# Patient Record
Sex: Male | Born: 1945 | Race: White | Hispanic: No | Marital: Married | Smoking: Former smoker
Health system: Southern US, Community
[De-identification: ages and names within clinical notes are randomized; demographics above are authoritative.]

## PROBLEM LIST (undated history)

## (undated) DIAGNOSIS — L309 Dermatitis, unspecified: Secondary | ICD-10-CM

## (undated) DIAGNOSIS — J3489 Other specified disorders of nose and nasal sinuses: Secondary | ICD-10-CM

## (undated) DIAGNOSIS — R51 Headache: Secondary | ICD-10-CM

## (undated) DIAGNOSIS — L570 Actinic keratosis: Secondary | ICD-10-CM

## (undated) DIAGNOSIS — R519 Headache, unspecified: Secondary | ICD-10-CM

## (undated) DIAGNOSIS — C4491 Basal cell carcinoma of skin, unspecified: Secondary | ICD-10-CM

## (undated) DIAGNOSIS — T7840XA Allergy, unspecified, initial encounter: Secondary | ICD-10-CM

## (undated) DIAGNOSIS — G8929 Other chronic pain: Secondary | ICD-10-CM

## (undated) HISTORY — PX: FOOT SURGERY: SHX648

## (undated) HISTORY — DX: Headache: R51

## (undated) HISTORY — DX: Dermatitis, unspecified: L30.9

## (undated) HISTORY — DX: Allergy, unspecified, initial encounter: T78.40XA

## (undated) HISTORY — DX: Headache, unspecified: R51.9

## (undated) HISTORY — DX: Actinic keratosis: L57.0

## (undated) HISTORY — DX: Other chronic pain: G89.29

## (undated) HISTORY — DX: Basal cell carcinoma of skin, unspecified: C44.91

## (undated) HISTORY — DX: Other specified disorders of nose and nasal sinuses: J34.89

---

## 2007-12-05 HISTORY — PX: CHOLECYSTECTOMY: SHX55

## 2012-03-08 DIAGNOSIS — Q6689 Other  specified congenital deformities of feet: Secondary | ICD-10-CM | POA: Diagnosis not present

## 2012-03-08 DIAGNOSIS — G5760 Lesion of plantar nerve, unspecified lower limb: Secondary | ICD-10-CM | POA: Diagnosis not present

## 2012-06-07 DIAGNOSIS — Z85828 Personal history of other malignant neoplasm of skin: Secondary | ICD-10-CM | POA: Diagnosis not present

## 2012-06-07 DIAGNOSIS — D235 Other benign neoplasm of skin of trunk: Secondary | ICD-10-CM | POA: Diagnosis not present

## 2012-06-07 DIAGNOSIS — L719 Rosacea, unspecified: Secondary | ICD-10-CM | POA: Diagnosis not present

## 2012-07-20 DIAGNOSIS — Z0000 Encounter for general adult medical examination without abnormal findings: Secondary | ICD-10-CM | POA: Diagnosis not present

## 2012-07-20 DIAGNOSIS — E785 Hyperlipidemia, unspecified: Secondary | ICD-10-CM | POA: Diagnosis not present

## 2012-08-16 DIAGNOSIS — Z23 Encounter for immunization: Secondary | ICD-10-CM | POA: Diagnosis not present

## 2012-08-30 DIAGNOSIS — Z86010 Personal history of colonic polyps: Secondary | ICD-10-CM | POA: Diagnosis not present

## 2012-08-30 DIAGNOSIS — Z800 Family history of malignant neoplasm of digestive organs: Secondary | ICD-10-CM | POA: Diagnosis not present

## 2012-08-30 DIAGNOSIS — K2270 Barrett's esophagus without dysplasia: Secondary | ICD-10-CM | POA: Diagnosis not present

## 2012-10-24 DIAGNOSIS — K2270 Barrett's esophagus without dysplasia: Secondary | ICD-10-CM | POA: Diagnosis not present

## 2012-10-24 DIAGNOSIS — K2990 Gastroduodenitis, unspecified, without bleeding: Secondary | ICD-10-CM | POA: Diagnosis not present

## 2012-10-24 DIAGNOSIS — K2970 Gastritis, unspecified, without bleeding: Secondary | ICD-10-CM | POA: Diagnosis not present

## 2012-10-24 DIAGNOSIS — D131 Benign neoplasm of stomach: Secondary | ICD-10-CM | POA: Diagnosis not present

## 2012-10-24 DIAGNOSIS — K449 Diaphragmatic hernia without obstruction or gangrene: Secondary | ICD-10-CM | POA: Diagnosis not present

## 2012-11-13 DIAGNOSIS — Z6826 Body mass index (BMI) 26.0-26.9, adult: Secondary | ICD-10-CM | POA: Diagnosis not present

## 2012-11-13 DIAGNOSIS — J019 Acute sinusitis, unspecified: Secondary | ICD-10-CM | POA: Diagnosis not present

## 2012-12-17 DIAGNOSIS — J342 Deviated nasal septum: Secondary | ICD-10-CM | POA: Diagnosis not present

## 2012-12-17 DIAGNOSIS — J343 Hypertrophy of nasal turbinates: Secondary | ICD-10-CM | POA: Diagnosis not present

## 2012-12-17 DIAGNOSIS — R51 Headache: Secondary | ICD-10-CM | POA: Diagnosis not present

## 2012-12-17 DIAGNOSIS — J3489 Other specified disorders of nose and nasal sinuses: Secondary | ICD-10-CM | POA: Diagnosis not present

## 2013-01-13 DIAGNOSIS — R51 Headache: Secondary | ICD-10-CM | POA: Diagnosis not present

## 2013-01-13 DIAGNOSIS — J3489 Other specified disorders of nose and nasal sinuses: Secondary | ICD-10-CM | POA: Diagnosis not present

## 2013-01-13 DIAGNOSIS — J342 Deviated nasal septum: Secondary | ICD-10-CM | POA: Diagnosis not present

## 2013-01-17 DIAGNOSIS — J3489 Other specified disorders of nose and nasal sinuses: Secondary | ICD-10-CM | POA: Diagnosis not present

## 2013-01-24 DIAGNOSIS — J342 Deviated nasal septum: Secondary | ICD-10-CM | POA: Diagnosis not present

## 2013-01-24 DIAGNOSIS — Q300 Choanal atresia: Secondary | ICD-10-CM | POA: Diagnosis not present

## 2013-03-14 DIAGNOSIS — R51 Headache: Secondary | ICD-10-CM | POA: Diagnosis not present

## 2013-03-14 DIAGNOSIS — J310 Chronic rhinitis: Secondary | ICD-10-CM | POA: Diagnosis not present

## 2013-03-14 DIAGNOSIS — J342 Deviated nasal septum: Secondary | ICD-10-CM | POA: Diagnosis not present

## 2013-03-17 ENCOUNTER — Other Ambulatory Visit: Payer: Self-pay | Admitting: Specialist

## 2013-03-17 DIAGNOSIS — Z049 Encounter for examination and observation for unspecified reason: Secondary | ICD-10-CM | POA: Diagnosis not present

## 2013-03-17 DIAGNOSIS — Z79899 Other long term (current) drug therapy: Secondary | ICD-10-CM | POA: Diagnosis not present

## 2013-03-17 DIAGNOSIS — G4452 New daily persistent headache (NDPH): Secondary | ICD-10-CM | POA: Diagnosis not present

## 2013-03-21 ENCOUNTER — Ambulatory Visit
Admission: RE | Admit: 2013-03-21 | Discharge: 2013-03-21 | Disposition: A | Discharge status: Home / Self Care | Payer: Medicare Other | Source: Ambulatory Visit | Attending: Specialist | Admitting: Specialist

## 2013-03-21 DIAGNOSIS — I7789 Other specified disorders of arteries and arterioles: Secondary | ICD-10-CM | POA: Diagnosis not present

## 2013-03-21 DIAGNOSIS — G4452 New daily persistent headache (NDPH): Secondary | ICD-10-CM

## 2013-03-21 DIAGNOSIS — R51 Headache: Secondary | ICD-10-CM | POA: Diagnosis not present

## 2013-03-21 MED ORDER — GADOBENATE DIMEGLUMINE 529 MG/ML IV SOLN
19.0000 mL | Freq: Once | INTRAVENOUS | Status: AC | PRN
  Administered 2013-03-21: 19 mL via INTRAVENOUS

## 2013-03-27 DIAGNOSIS — R51 Headache: Secondary | ICD-10-CM | POA: Diagnosis not present

## 2013-03-27 DIAGNOSIS — Z888 Allergy status to other drugs, medicaments and biological substances status: Secondary | ICD-10-CM | POA: Diagnosis not present

## 2013-03-27 DIAGNOSIS — G518 Other disorders of facial nerve: Secondary | ICD-10-CM | POA: Diagnosis not present

## 2013-03-27 DIAGNOSIS — IMO0001 Reserved for inherently not codable concepts without codable children: Secondary | ICD-10-CM | POA: Diagnosis not present

## 2013-03-27 DIAGNOSIS — Z6826 Body mass index (BMI) 26.0-26.9, adult: Secondary | ICD-10-CM | POA: Diagnosis not present

## 2013-03-27 DIAGNOSIS — J019 Acute sinusitis, unspecified: Secondary | ICD-10-CM | POA: Diagnosis not present

## 2013-03-27 DIAGNOSIS — M542 Cervicalgia: Secondary | ICD-10-CM | POA: Diagnosis not present

## 2013-04-10 DIAGNOSIS — R51 Headache: Secondary | ICD-10-CM | POA: Diagnosis not present

## 2013-04-10 DIAGNOSIS — IMO0001 Reserved for inherently not codable concepts without codable children: Secondary | ICD-10-CM | POA: Diagnosis not present

## 2013-04-10 DIAGNOSIS — G518 Other disorders of facial nerve: Secondary | ICD-10-CM | POA: Diagnosis not present

## 2013-04-10 DIAGNOSIS — M542 Cervicalgia: Secondary | ICD-10-CM | POA: Diagnosis not present

## 2013-04-17 DIAGNOSIS — J342 Deviated nasal septum: Secondary | ICD-10-CM | POA: Diagnosis not present

## 2013-04-17 DIAGNOSIS — J343 Hypertrophy of nasal turbinates: Secondary | ICD-10-CM | POA: Diagnosis not present

## 2013-04-17 DIAGNOSIS — J3489 Other specified disorders of nose and nasal sinuses: Secondary | ICD-10-CM | POA: Diagnosis not present

## 2013-05-01 DIAGNOSIS — IMO0001 Reserved for inherently not codable concepts without codable children: Secondary | ICD-10-CM | POA: Diagnosis not present

## 2013-05-01 DIAGNOSIS — M542 Cervicalgia: Secondary | ICD-10-CM | POA: Diagnosis not present

## 2013-05-01 DIAGNOSIS — R51 Headache: Secondary | ICD-10-CM | POA: Diagnosis not present

## 2013-05-01 DIAGNOSIS — G518 Other disorders of facial nerve: Secondary | ICD-10-CM | POA: Diagnosis not present

## 2013-05-15 DIAGNOSIS — IMO0001 Reserved for inherently not codable concepts without codable children: Secondary | ICD-10-CM | POA: Diagnosis not present

## 2013-05-15 DIAGNOSIS — M542 Cervicalgia: Secondary | ICD-10-CM | POA: Diagnosis not present

## 2013-05-15 DIAGNOSIS — R51 Headache: Secondary | ICD-10-CM | POA: Diagnosis not present

## 2013-05-15 DIAGNOSIS — G518 Other disorders of facial nerve: Secondary | ICD-10-CM | POA: Diagnosis not present

## 2013-06-11 DIAGNOSIS — M542 Cervicalgia: Secondary | ICD-10-CM | POA: Diagnosis not present

## 2013-06-11 DIAGNOSIS — G518 Other disorders of facial nerve: Secondary | ICD-10-CM | POA: Diagnosis not present

## 2013-06-11 DIAGNOSIS — R51 Headache: Secondary | ICD-10-CM | POA: Diagnosis not present

## 2013-06-11 DIAGNOSIS — G4452 New daily persistent headache (NDPH): Secondary | ICD-10-CM | POA: Diagnosis not present

## 2013-06-12 DIAGNOSIS — D235 Other benign neoplasm of skin of trunk: Secondary | ICD-10-CM | POA: Diagnosis not present

## 2013-06-12 DIAGNOSIS — L819 Disorder of pigmentation, unspecified: Secondary | ICD-10-CM | POA: Diagnosis not present

## 2013-06-12 DIAGNOSIS — L570 Actinic keratosis: Secondary | ICD-10-CM | POA: Diagnosis not present

## 2013-07-24 DIAGNOSIS — M542 Cervicalgia: Secondary | ICD-10-CM | POA: Diagnosis not present

## 2013-07-24 DIAGNOSIS — R51 Headache: Secondary | ICD-10-CM | POA: Diagnosis not present

## 2013-07-24 DIAGNOSIS — G4452 New daily persistent headache (NDPH): Secondary | ICD-10-CM | POA: Diagnosis not present

## 2013-07-24 DIAGNOSIS — G518 Other disorders of facial nerve: Secondary | ICD-10-CM | POA: Diagnosis not present

## 2013-08-11 DIAGNOSIS — Z0000 Encounter for general adult medical examination without abnormal findings: Secondary | ICD-10-CM | POA: Diagnosis not present

## 2013-08-11 DIAGNOSIS — Z9181 History of falling: Secondary | ICD-10-CM | POA: Diagnosis not present

## 2013-08-11 DIAGNOSIS — Z6826 Body mass index (BMI) 26.0-26.9, adult: Secondary | ICD-10-CM | POA: Diagnosis not present

## 2013-08-11 DIAGNOSIS — I498 Other specified cardiac arrhythmias: Secondary | ICD-10-CM | POA: Diagnosis not present

## 2013-08-11 DIAGNOSIS — Z125 Encounter for screening for malignant neoplasm of prostate: Secondary | ICD-10-CM | POA: Diagnosis not present

## 2013-08-11 DIAGNOSIS — E785 Hyperlipidemia, unspecified: Secondary | ICD-10-CM | POA: Diagnosis not present

## 2013-08-19 DIAGNOSIS — Z23 Encounter for immunization: Secondary | ICD-10-CM | POA: Diagnosis not present

## 2013-10-23 DIAGNOSIS — Z79899 Other long term (current) drug therapy: Secondary | ICD-10-CM | POA: Diagnosis not present

## 2013-10-23 DIAGNOSIS — K2270 Barrett's esophagus without dysplasia: Secondary | ICD-10-CM | POA: Diagnosis not present

## 2013-10-23 DIAGNOSIS — Z800 Family history of malignant neoplasm of digestive organs: Secondary | ICD-10-CM | POA: Diagnosis not present

## 2013-10-23 DIAGNOSIS — K219 Gastro-esophageal reflux disease without esophagitis: Secondary | ICD-10-CM | POA: Diagnosis not present

## 2013-10-23 DIAGNOSIS — Z86010 Personal history of colonic polyps: Secondary | ICD-10-CM | POA: Diagnosis not present

## 2013-10-27 DIAGNOSIS — R51 Headache: Secondary | ICD-10-CM | POA: Diagnosis not present

## 2013-10-27 DIAGNOSIS — G4452 New daily persistent headache (NDPH): Secondary | ICD-10-CM | POA: Diagnosis not present

## 2013-12-17 DIAGNOSIS — Z86010 Personal history of colonic polyps: Secondary | ICD-10-CM | POA: Diagnosis not present

## 2013-12-17 DIAGNOSIS — Z800 Family history of malignant neoplasm of digestive organs: Secondary | ICD-10-CM | POA: Diagnosis not present

## 2013-12-17 DIAGNOSIS — Z1211 Encounter for screening for malignant neoplasm of colon: Secondary | ICD-10-CM | POA: Diagnosis not present

## 2013-12-17 DIAGNOSIS — D126 Benign neoplasm of colon, unspecified: Secondary | ICD-10-CM | POA: Diagnosis not present

## 2014-01-06 HISTORY — PX: TOOTH EXTRACTION: SUR596

## 2014-01-15 DIAGNOSIS — J209 Acute bronchitis, unspecified: Secondary | ICD-10-CM | POA: Diagnosis not present

## 2014-01-15 DIAGNOSIS — Z6825 Body mass index (BMI) 25.0-25.9, adult: Secondary | ICD-10-CM | POA: Diagnosis not present

## 2014-01-28 DIAGNOSIS — G4452 New daily persistent headache (NDPH): Secondary | ICD-10-CM | POA: Diagnosis not present

## 2014-01-28 DIAGNOSIS — Z79899 Other long term (current) drug therapy: Secondary | ICD-10-CM | POA: Diagnosis not present

## 2014-01-30 DIAGNOSIS — J209 Acute bronchitis, unspecified: Secondary | ICD-10-CM | POA: Diagnosis not present

## 2014-01-30 DIAGNOSIS — R0989 Other specified symptoms and signs involving the circulatory and respiratory systems: Secondary | ICD-10-CM | POA: Diagnosis not present

## 2014-02-24 ENCOUNTER — Ambulatory Visit (INDEPENDENT_AMBULATORY_CARE_PROVIDER_SITE_OTHER): Payer: Medicare Other | Admitting: Internal Medicine

## 2014-02-24 ENCOUNTER — Encounter: Payer: Self-pay | Admitting: Internal Medicine

## 2014-02-24 ENCOUNTER — Encounter (INDEPENDENT_AMBULATORY_CARE_PROVIDER_SITE_OTHER): Payer: Self-pay

## 2014-02-24 VITALS — BP 122/80 | HR 90 | Temp 98.3°F | Ht 73.5 in | Wt 190.8 lb

## 2014-02-24 DIAGNOSIS — R05 Cough: Secondary | ICD-10-CM

## 2014-02-24 DIAGNOSIS — R059 Cough, unspecified: Secondary | ICD-10-CM

## 2014-02-24 DIAGNOSIS — R058 Other specified cough: Secondary | ICD-10-CM

## 2014-02-24 MED ORDER — AMOXICILLIN-POT CLAVULANATE 875-125 MG PO TABS: 1.0000 | ORAL_TABLET | Freq: Two times a day (BID) | ORAL | Status: DC

## 2014-02-24 MED ORDER — ACETAMINOPHEN-CODEINE #3 300-30 MG PO TABS: ORAL_TABLET | ORAL | Status: AC

## 2014-02-24 NOTE — Progress Notes (Signed)
   Subjective:    Patient ID: Mark Valencia, male    DOB: 01/28/1946 MRN: [id number]  HPI  62 yowm remote minimal smoker with runny nose and sinus pain in 2013 eval Dr Gaylyn Cheers ENT with neg CT sinus then [facility] eval neg > neurology eval> MRI nl rec neurontin did help pain some but continued watery rhinitis gradually improved then had 4 different procedures R upper molar and after extraction feb 3rd  2015 developed cough 48h p procedure started with laugh then coughed ever since so referred by Dr Carolanne Grumbling for pulmonary eval 02/24/2014    02/24/2014 1st [facility] office visit/ Surabhi Gadea Chief Complaint  Patient presents with  . Pulmonary Consult    Referred per Dr. Nelda Bucks. Pt c/o cough since beginning of Feb 2015. Cough is non prod and seems worse first thing in the am.   does ok supine, worse when moving.  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 No unusual exp hx or h/o childhood pna/ asthma or knowledge of premature birth.  Sleeping ok without nocturnal  or early am exacerbation  of respiratory  c/o's or need for noct saba. Also denies any obvious fluctuation of symptoms with weather or environmental changes or other aggravating or alleviating factors except as outlined above   Current Medications, Allergies, Complete Past Medical History, Past Surgical History, Family History, and Social History were reviewed in [employer] record.              Review of Systems  Constitutional: Negative for fever, chills, activity change, appetite change and unexpected weight change.  HENT: Positive for dental  problem. Negative for congestion, postnasal drip, rhinorrhea, sneezing, sore throat, trouble swallowing and voice change.   Eyes: Negative for visual disturbance.  Respiratory: Positive for cough. Negative for choking and shortness of breath.   Cardiovascular: Negative for chest pain and leg swelling.  Gastrointestinal: Negative for nausea, vomiting and abdominal pain.  Genitourinary: Negative for difficulty urinating.  Musculoskeletal: Negative for arthralgias.  Skin: Negative for rash.  Neurological: Positive for headaches.  Psychiatric/Behavioral: Negative for behavioral problems and confusion.       Objective:   Physical Exam  amb wm tends to ramble nad  Wt Readings from Last 3 Encounters:  02/24/14 190 lb 12.8 oz (86.546 kg)      HEENT: nl dentition, turbinates, and orophanx. Nl external ear canals without cough reflex   NECK :  without JVD/Nodes/TM/ nl carotid upstrokes bilaterally   LUNGS: no acc muscle use, clear to A and P bilaterally without cough on insp or exp maneuvers   CV:  RRR  no s3 or murmur or increase in P2, no edema   ABD:  soft and nontender with nl excursion in the supine position. No bruits or organomegaly, bowel sounds nl  MS:  warm without deformities, calf tenderness, cyanosis or clubbing  SKIN: warm and dry without lesions    NEURO:  alert, approp, no deficits    cxr 01/30/14  No active dz      Assessment & Plan:

## 2014-02-24 NOTE — Patient Instructions (Signed)
Continue nexium 40 Take 30- 60 min before your first and last meals of the day   GERD (REFLUX)  is an extremely common cause of respiratory symptoms, many times with no significant heartburn at all.    It can be treated with medication, but also with lifestyle changes including avoidance of late meals, excessive alcohol, smoking cessation, and avoid fatty foods, chocolate, peppermint, colas, red wine, and acidic juices such as orange juice.  NO MINT OR MENTHOL PRODUCTS SO NO COUGH DROPS  USE SUGARLESS CANDY INSTEAD (jolley ranchers or Stover's)  NO OIL BASED VITAMINS - use powdered substitutes.    Take delsym two tsp every 12 hours and supplement if needed with tylenol #3  up to 2 every 4 hours to suppress the urge to cough. Swallowing water or using ice chips/non mint and menthol containing candies (such as lifesavers or sugarless jolly ranchers) are also effective.  You should rest your voice and avoid activities that you know make you cough.  Once you have eliminated the cough for 3 straight days try reducing theTylenol #3  first,  then the delsym as tolerated.    Augmentin 875 mg take one pill twice daily  X 10 days - take at breakfast and supper with large glass of water.  It would help reduce the usual side effects (diarrhea and yeast infections) if you ate cultured yogurt at lunch.   Please see patient coordinator before you leave today  to schedule CT Sinus   Return end of next week to check your progress

## 2014-02-25 ENCOUNTER — Ambulatory Visit (INDEPENDENT_AMBULATORY_CARE_PROVIDER_SITE_OTHER)
Admission: RE | Admit: 2014-02-25 | Discharge: 2014-02-25 | Disposition: A | Discharge status: Home / Self Care | Payer: Medicare Other | Source: Ambulatory Visit | Attending: Internal Medicine | Admitting: Internal Medicine

## 2014-02-25 ENCOUNTER — Encounter: Payer: Self-pay | Admitting: Internal Medicine

## 2014-02-25 DIAGNOSIS — R059 Cough, unspecified: Secondary | ICD-10-CM | POA: Diagnosis not present

## 2014-02-25 DIAGNOSIS — R058 Other specified cough: Secondary | ICD-10-CM

## 2014-02-25 DIAGNOSIS — R05 Cough: Secondary | ICD-10-CM

## 2014-02-25 NOTE — Assessment & Plan Note (Addendum)
The most common causes of chronic cough in immunocompetent adults include the following: upper airway cough syndrome (UACS), previously referred to as postnasal drip syndrome (PNDS), which is caused by variety of rhinosinus conditions; (2) asthma; (3) GERD; (4) chronic bronchitis from cigarette smoking or other inhaled environmental irritants; (5) nonasthmatic eosinophilic bronchitis; and (6) bronchiectasis.   These conditions, singly or in combination, have accounted for up to 94% of the causes of chronic cough in prospective studies.   Other conditions have constituted no >6% of the causes in prospective studies These have included bronchogenic carcinoma, chronic interstitial pneumonia, sarcoidosis, left ventricular failure, ACEI-induced cough, and aspiration from a condition associated with pharyngeal dysfunction.    Chronic cough is often simultaneously caused by more than one condition. A single cause has been found from 38 to 82% of the time, multiple causes from 18 to 62%. Multiply caused cough has been the result of three diseases up to 42% of the time.       Based on hx and exam, this is most likely:  Classic Upper airway cough syndrome, so named because it's frequently impossible to sort out how much is  CR/sinusitis with freq throat clearing (which can be related to primary GERD)   vs  causing  secondary (" extra esophageal")  GERD from wide swings in gastric pressure that occur with throat clearing, often  promoting self use of mint and menthol lozenges that reduce the lower esophageal sphincter tone and exacerbate the problem further in a cyclical fashion.   These are the same pts (now being labeled as having "irritable larynx syndrome" by some cough centers) who not infrequently have a history of having failed to tolerate ace inhibitors,  dry powder inhalers or biphosphonates or report having atypical reflux symptoms that don't respond to standard doses of PPI , and are easily confused as  having aecopd or asthma flares by even experienced allergists/ pulmonologists.   The first step is to check sinus ct, start augmentin in case this is upper tooth related sinusitis, and in meantime continue  maximize acid suppression and eliminate cyclical coughing then regroup if the cough persists.  See instructions for specific recommendations which were reviewed directly with the patient who was given a copy with highlighter outlining the key components.

## 2014-02-26 NOTE — Progress Notes (Signed)
Quick Note:  Spoke with pt and notified of results per Dr. Wert. Pt verbalized understanding and denied any questions.  ______ 

## 2014-03-05 ENCOUNTER — Ambulatory Visit (INDEPENDENT_AMBULATORY_CARE_PROVIDER_SITE_OTHER): Payer: Medicare Other | Admitting: Internal Medicine

## 2014-03-05 ENCOUNTER — Encounter: Payer: Self-pay | Admitting: Internal Medicine

## 2014-03-05 VITALS — BP 112/64 | HR 71 | Temp 98.0°F | Ht 73.5 in | Wt 195.2 lb

## 2014-03-05 DIAGNOSIS — R05 Cough: Secondary | ICD-10-CM | POA: Diagnosis not present

## 2014-03-05 DIAGNOSIS — R059 Cough, unspecified: Secondary | ICD-10-CM | POA: Diagnosis not present

## 2014-03-05 DIAGNOSIS — R058 Other specified cough: Secondary | ICD-10-CM

## 2014-03-05 MED ORDER — AMOXICILLIN-POT CLAVULANATE 875-125 MG PO TABS: 1.0000 | ORAL_TABLET | Freq: Two times a day (BID) | ORAL | Status: AC

## 2014-03-05 NOTE — Progress Notes (Signed)
Subjective:    Patient ID: Mark Valencia, male    DOB: 1946-01-02   MRN: [id number]    Brief patient profile:  88 yowm remote minimal smoker with runny nose and sinus pain in 2013 eval Mark Mark Valencia ENT with neg CT sinus then Mark Valencia eval neg > neurology eval> MRI nl rec neurontin did help pain some but continued watery rhinitis gradually improved then had 4 different procedures R upper molar and after extraction feb 3rd  2015 developed cough 48h p procedure started with laugh then coughed ever since so referred by Mark Valencia for pulmonary eval 02/24/2014    History of Present Illness  02/24/2014 1st [facility] office visit/ Mark Valencia Chief Complaint  Patient presents with  . Pulmonary Consult    Referred per Mark Valencia. Pt c/o cough since beginning of Feb 2015. Cough is non prod and seems worse first thing in the am.   does ok supine, worse when moving.  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   Augmentin 875 mg take one pill twice daily  X 10 days -   o schedule CT Sinus > pos mucosal thickening in area of tooth surgery    03/05/2014 f/u ov/Mark Valencia re: throat drainage/ always clear/ no trouble at hs ever Chief Complaint  Patient presents with  . Follow-up    Pt states that his cough is some better, but not completely resolved. No new co's today.   more recent problem green mucus only in daytime > still present but improved and  Not limited by breathing from desired activities.  Had prev seen ent docx 2 for symptoms of excess pnds assoc  with "sinus pain"  but neg w/u per pt and sinus pain improved on neurontin in low doses.  Previous to tootch extraction did not have green drainage and already went back to dentist x 2 with Disc of new R max sinus finding in hand.   No obvious day to day or daytime variabilty or assoc  cp or chest tightness, subjective wheeze overt   hb symptoms. No unusual exp hx or h/o childhood pna/ asthma or knowledge of premature birth.  Sleeping ok without nocturnal  or early am exacerbation  of respiratory  c/o's or need for noct saba. Also denies any obvious fluctuation of symptoms with weather or environmental changes or other aggravating or alleviating factors except as outlined above   Current Medications, Allergies, Complete Past Medical History, Past Surgical History, Family History, and Social History were reviewed in [employer] record.  ROS  The following are not active complaints unless bolded sore throat, dysphagia, dental problems, itching, sneezing,  nasal congestion or excess/ purulent secretions, ear ache,   fever, chills, sweats, unintended wt loss, pleuritic or exertional cp, hemoptysis,  orthopnea pnd or leg swelling, presyncope, palpitations, heartburn, abdominal pain, anorexia, nausea, vomiting, diarrhea  or change in bowel or urinary habits, change in stools or urine, dysuria,hematuria,  rash, arthralgias,  visual complaints, headache, numbness weakness or ataxia or problems with walking or coordination,  change in mood/affect or memory.           Objective:   Physical Exam  amb wm tends to ramble nad   Wt Readings from Last 3 Encounters:  03/05/14 195 lb 3.2 oz (88.542 kg)  02/24/14 190 lb 12.8 oz (86.546 kg)         HEENT: nl dentition, turbinates, and orophanx. Nl external ear canals without cough reflex   NECK :  without JVD/Nodes/TM/ nl carotid upstrokes bilaterally   LUNGS: no acc muscle use, clear to A and P bilaterally without cough on insp or  exp maneuvers   CV:  RRR  no s3 or murmur or increase in P2, no edema   ABD:  soft and nontender with nl excursion in the supine position. No bruits or organomegaly, bowel sounds nl  MS:  warm without deformities, calf tenderness, cyanosis or clubbing  SKIN: warm and dry without lesions    NEURO:  alert, approp, no deficits    cxr 01/30/14  No active dz      Assessment & Plan:

## 2014-03-05 NOTE — Patient Instructions (Addendum)
Augmentin 875 mg take one pill twice daily  X 10 days - take at breakfast and supper with large glass of water.  It would help reduce the usual side effects (diarrhea and yeast infections) if you ate cultured yogurt at lunch.   If mucus is still green after next round of augmentin you will additional ENT evaluation of your R maxillary sinus (I don't know what the relation is with your tooth surgery but Dr Lucia Gaskins could help sort this out)  Increase neurontin to 300 mg three times a day and this should help your throat irritation

## 2014-03-06 NOTE — Assessment & Plan Note (Signed)
-   Sinus Ct  02/25/2014 >> Negative paranasal sinuses except for mild chronic maxillary alveolar recess mucosal thickening.> rx augmentin x 20 d  Still has sensation of too much throat mucus out of proportion to physical findings so try first increase chronic neurontin dose to 300 mg tid  If green drainage recurs will need ent re-eval

## 2014-03-31 DIAGNOSIS — L821 Other seborrheic keratosis: Secondary | ICD-10-CM | POA: Diagnosis not present

## 2014-03-31 DIAGNOSIS — L819 Disorder of pigmentation, unspecified: Secondary | ICD-10-CM | POA: Diagnosis not present

## 2014-03-31 DIAGNOSIS — D235 Other benign neoplasm of skin of trunk: Secondary | ICD-10-CM | POA: Diagnosis not present

## 2014-03-31 DIAGNOSIS — L820 Inflamed seborrheic keratosis: Secondary | ICD-10-CM | POA: Diagnosis not present

## 2014-04-01 DIAGNOSIS — R059 Cough, unspecified: Secondary | ICD-10-CM | POA: Diagnosis not present

## 2014-04-01 DIAGNOSIS — K2270 Barrett's esophagus without dysplasia: Secondary | ICD-10-CM | POA: Diagnosis not present

## 2014-04-01 DIAGNOSIS — K219 Gastro-esophageal reflux disease without esophagitis: Secondary | ICD-10-CM | POA: Diagnosis not present

## 2014-04-01 DIAGNOSIS — Z86010 Personal history of colonic polyps: Secondary | ICD-10-CM | POA: Diagnosis not present

## 2014-04-01 DIAGNOSIS — R05 Cough: Secondary | ICD-10-CM | POA: Diagnosis not present

## 2014-04-30 DIAGNOSIS — L820 Inflamed seborrheic keratosis: Secondary | ICD-10-CM | POA: Diagnosis not present

## 2014-05-03 IMAGING — CT CT PARANASAL SINUSES LIMITED
1 of 2 series · 16 of 19 positions shown, 20 images · non-contrast
Comparison: Brain MRI 03/21/2013.  Paranasal sinus CT 01/17/2013.

CLINICAL DATA: 67-year-old male with cough. Chronic sinus pain.
Upper airway cough syndrome. Initial encounter.

EXAM:
CT PARANASAL SINUS WITHOUT CONTRAST
TECHNIQUE: Multidetector CT images of the paranasal sinuses were obtained using
the standard protocol without intravenous contrast.

[Series 4: ltd sinus 3.0 h30s · axial · 0.35mm/px · z∈[-74,+21]mm · 16 of 18 slices shown, 20 images]
[im 2/18  brain]
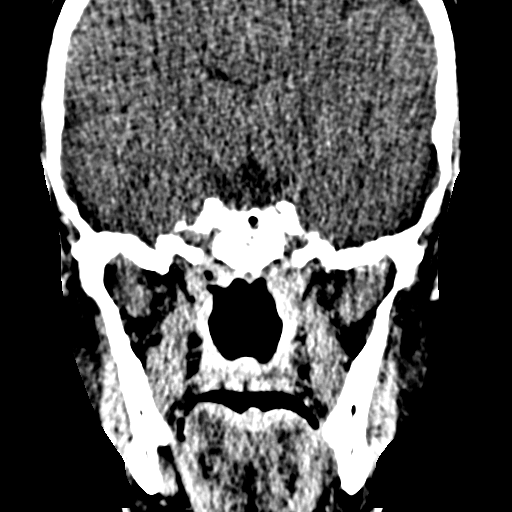
[im 2/18  bone]
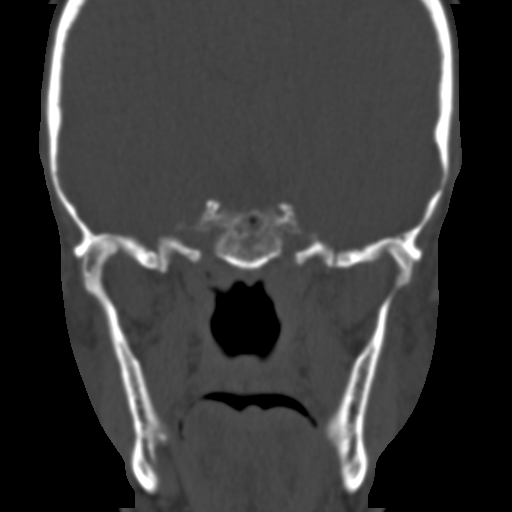
[im 3/18  bone]
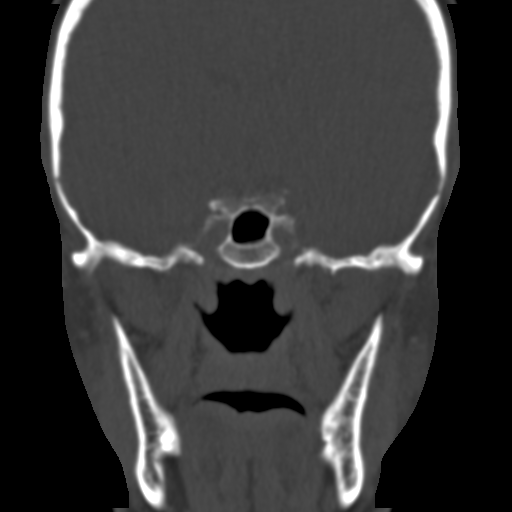
[im 4/18  bone]
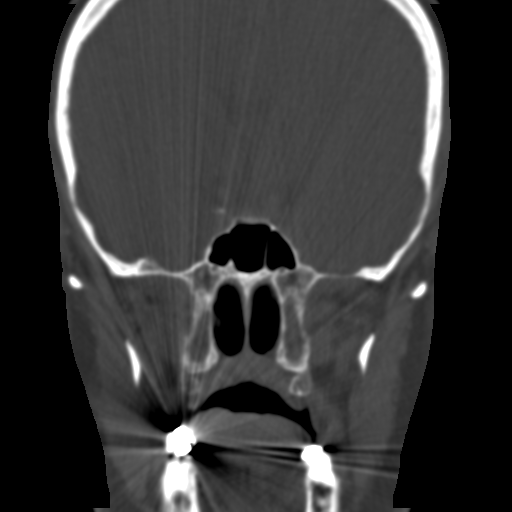
[im 5/18  bone]
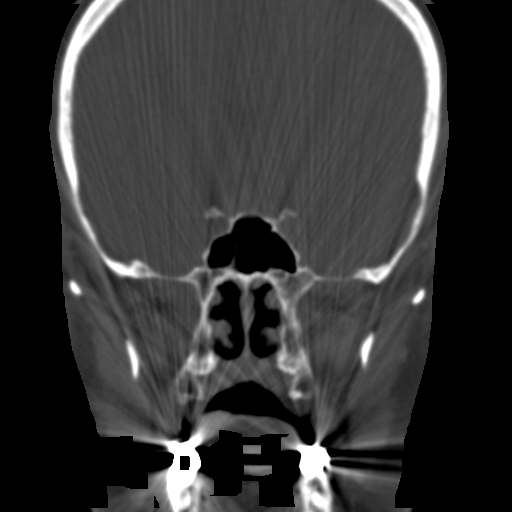
[im 6/18  brain]
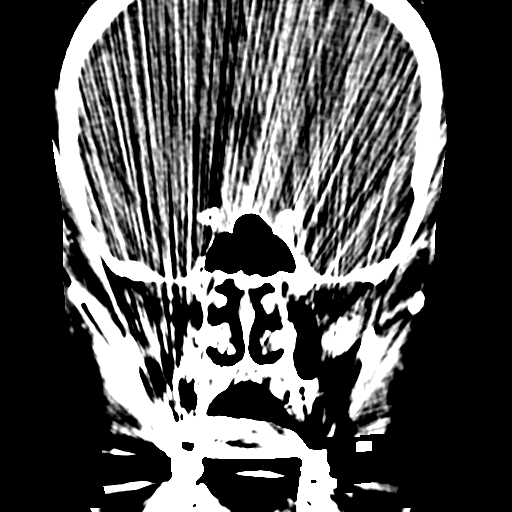
[im 6/18  bone]
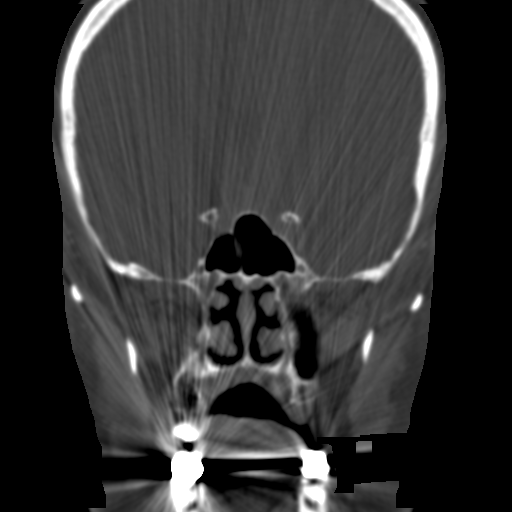
[im 7/18  bone]
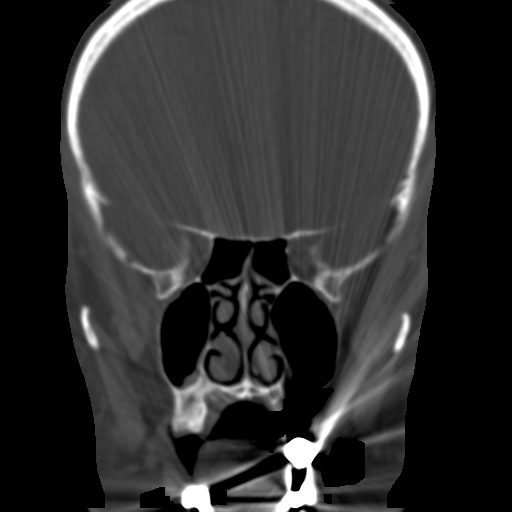
[im 8/18  bone]
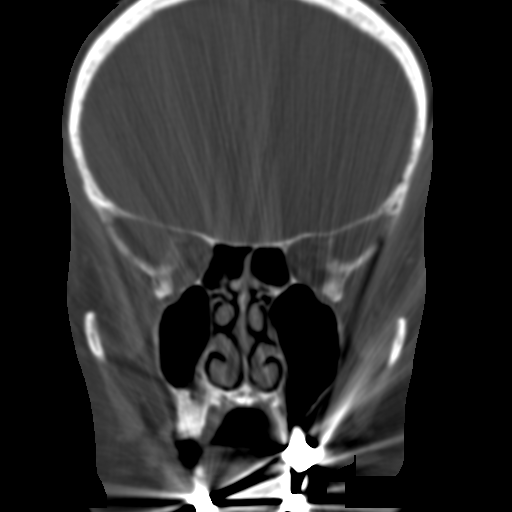
[im 9/18  bone]
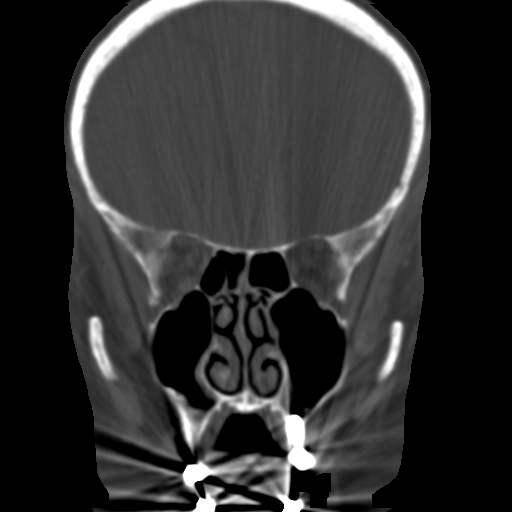
[im 10/18  brain]
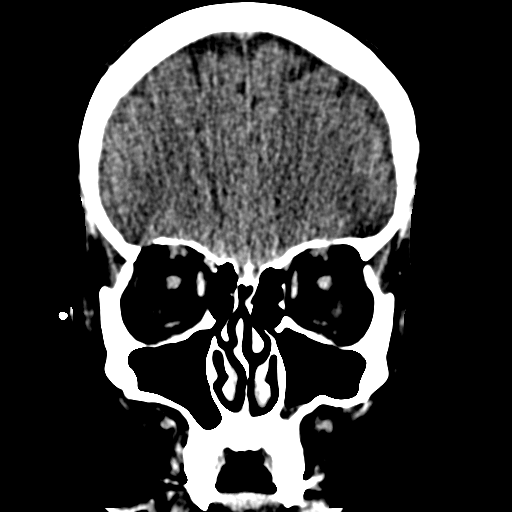
[im 10/18  bone]
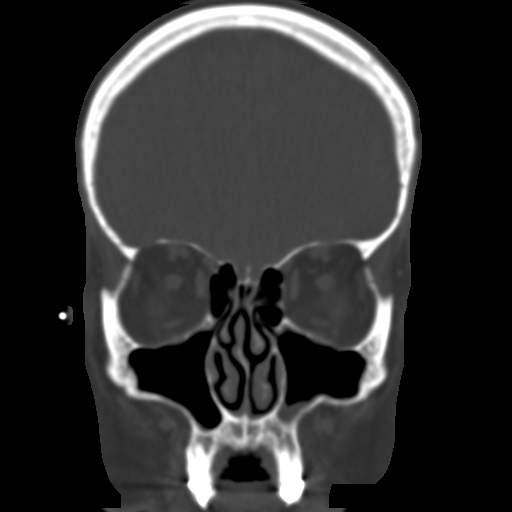
[im 11/18  bone]
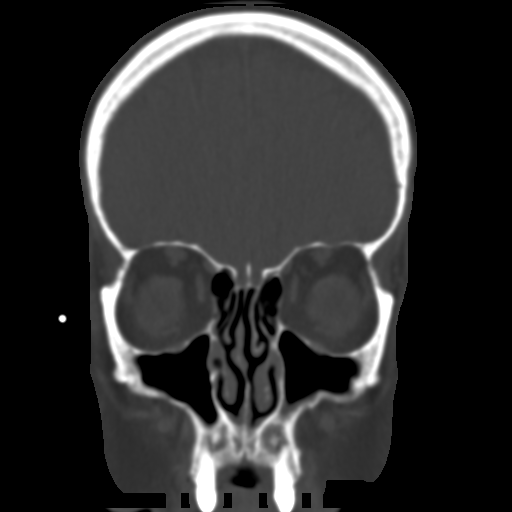
[im 12/18  bone]
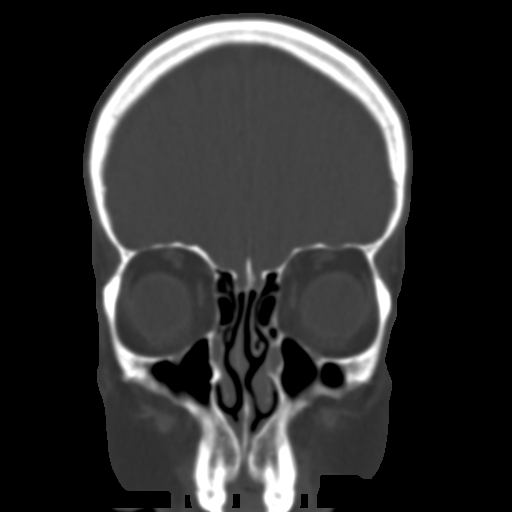
[im 13/18  bone]
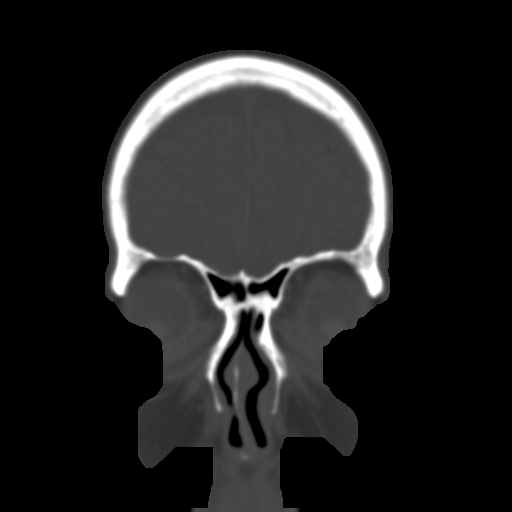
[im 14/18  brain]
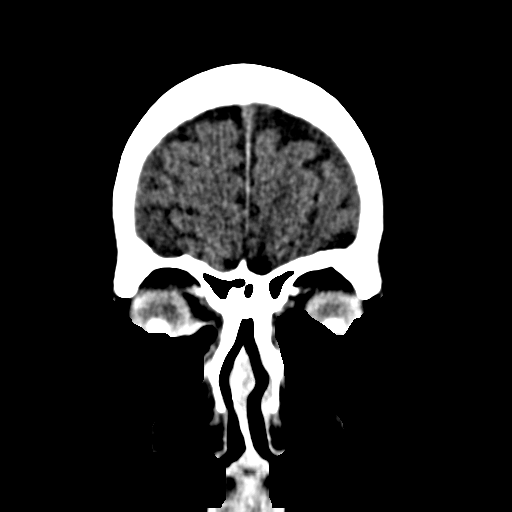
[im 14/18  bone]
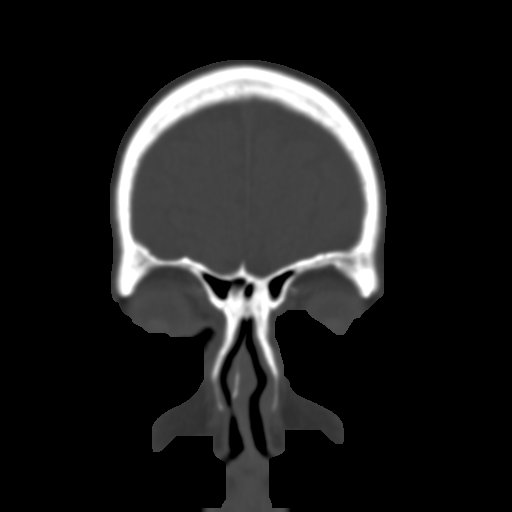
[im 15/18  bone]
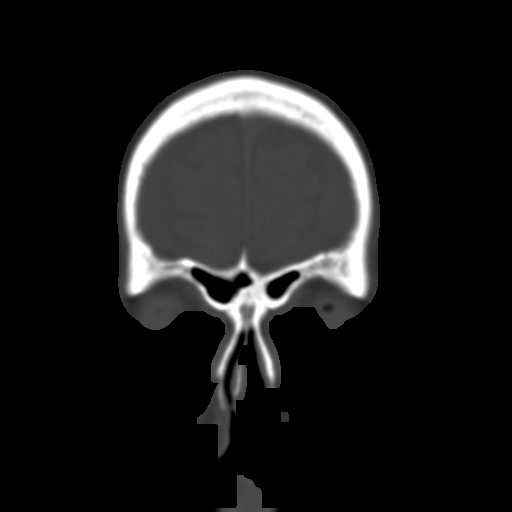
[im 16/18  bone]
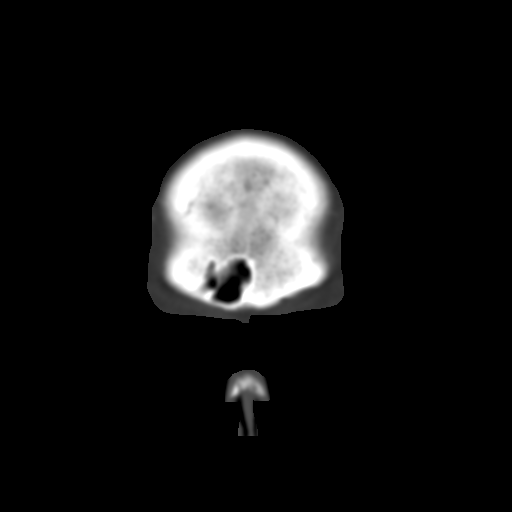
[im 17/18  bone]
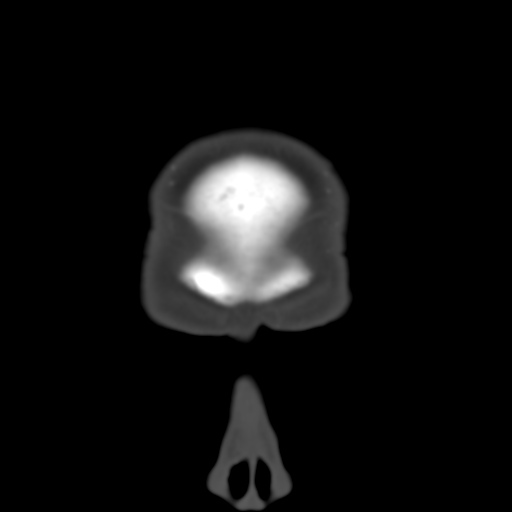

[16 of 19 positions shown; findings below may reference images not displayed]

FINDINGS: Grossly stable and negative visualized non contrast brain
parenchyma. Grossly negative visualized orbit and face soft tissues.

The sphenoid sinuses are clear.

The ethmoid air cells are clear.

The frontal sinuses are clear.

Both maxillary sinuses are clear except for Mild chronic mucosal
thickening in the alveolar recesses.

Both OMCs appear patent.

Rightward nasal septal deviation. No acute osseous abnormality
identified.
IMPRESSION: Negative paranasal sinuses except for mild chronic maxillary
alveolar recess mucosal thickening.

## 2014-05-28 DIAGNOSIS — G4452 New daily persistent headache (NDPH): Secondary | ICD-10-CM | POA: Diagnosis not present

## 2014-08-12 DIAGNOSIS — E785 Hyperlipidemia, unspecified: Secondary | ICD-10-CM | POA: Diagnosis not present

## 2014-08-12 DIAGNOSIS — Z125 Encounter for screening for malignant neoplasm of prostate: Secondary | ICD-10-CM | POA: Diagnosis not present

## 2014-08-12 DIAGNOSIS — Z23 Encounter for immunization: Secondary | ICD-10-CM | POA: Diagnosis not present

## 2014-08-12 DIAGNOSIS — Z0000 Encounter for general adult medical examination without abnormal findings: Secondary | ICD-10-CM | POA: Diagnosis not present

## 2014-08-12 DIAGNOSIS — Z6825 Body mass index (BMI) 25.0-25.9, adult: Secondary | ICD-10-CM | POA: Diagnosis not present

## 2014-09-22 DIAGNOSIS — G4452 New daily persistent headache (NDPH): Secondary | ICD-10-CM | POA: Diagnosis not present

## 2014-12-17 DIAGNOSIS — L719 Rosacea, unspecified: Secondary | ICD-10-CM | POA: Diagnosis not present

## 2014-12-17 DIAGNOSIS — L300 Nummular dermatitis: Secondary | ICD-10-CM | POA: Diagnosis not present

## 2015-01-21 DIAGNOSIS — Z79899 Other long term (current) drug therapy: Secondary | ICD-10-CM | POA: Diagnosis not present

## 2015-01-21 DIAGNOSIS — R05 Cough: Secondary | ICD-10-CM | POA: Diagnosis not present

## 2015-01-21 DIAGNOSIS — Z86010 Personal history of colonic polyps: Secondary | ICD-10-CM | POA: Diagnosis not present

## 2015-01-21 DIAGNOSIS — K2270 Barrett's esophagus without dysplasia: Secondary | ICD-10-CM | POA: Diagnosis not present

## 2015-01-21 DIAGNOSIS — K219 Gastro-esophageal reflux disease without esophagitis: Secondary | ICD-10-CM | POA: Diagnosis not present

## 2015-01-22 DIAGNOSIS — R51 Headache: Secondary | ICD-10-CM | POA: Diagnosis not present

## 2015-01-22 DIAGNOSIS — G4452 New daily persistent headache (NDPH): Secondary | ICD-10-CM | POA: Diagnosis not present

## 2015-02-18 DIAGNOSIS — Z6826 Body mass index (BMI) 26.0-26.9, adult: Secondary | ICD-10-CM | POA: Diagnosis not present

## 2015-02-18 DIAGNOSIS — J101 Influenza due to other identified influenza virus with other respiratory manifestations: Secondary | ICD-10-CM | POA: Diagnosis not present

## 2015-04-27 DIAGNOSIS — L821 Other seborrheic keratosis: Secondary | ICD-10-CM | POA: Diagnosis not present

## 2015-04-27 DIAGNOSIS — Z08 Encounter for follow-up examination after completed treatment for malignant neoplasm: Secondary | ICD-10-CM | POA: Diagnosis not present

## 2015-04-27 DIAGNOSIS — D225 Melanocytic nevi of trunk: Secondary | ICD-10-CM | POA: Diagnosis not present

## 2015-04-27 DIAGNOSIS — Z85828 Personal history of other malignant neoplasm of skin: Secondary | ICD-10-CM | POA: Diagnosis not present

## 2015-07-19 DIAGNOSIS — Z86010 Personal history of colonic polyps: Secondary | ICD-10-CM | POA: Diagnosis not present

## 2015-07-19 DIAGNOSIS — K2270 Barrett's esophagus without dysplasia: Secondary | ICD-10-CM | POA: Diagnosis not present

## 2015-07-19 DIAGNOSIS — Z800 Family history of malignant neoplasm of digestive organs: Secondary | ICD-10-CM | POA: Diagnosis not present

## 2015-07-19 DIAGNOSIS — K219 Gastro-esophageal reflux disease without esophagitis: Secondary | ICD-10-CM | POA: Diagnosis not present

## 2015-08-18 DIAGNOSIS — Z136 Encounter for screening for cardiovascular disorders: Secondary | ICD-10-CM | POA: Diagnosis not present

## 2015-08-18 DIAGNOSIS — R05 Cough: Secondary | ICD-10-CM | POA: Diagnosis not present

## 2015-08-18 DIAGNOSIS — K219 Gastro-esophageal reflux disease without esophagitis: Secondary | ICD-10-CM | POA: Diagnosis not present

## 2015-08-18 DIAGNOSIS — K2270 Barrett's esophagus without dysplasia: Secondary | ICD-10-CM | POA: Diagnosis not present

## 2015-08-18 DIAGNOSIS — F419 Anxiety disorder, unspecified: Secondary | ICD-10-CM | POA: Diagnosis not present

## 2015-08-18 DIAGNOSIS — Z125 Encounter for screening for malignant neoplasm of prostate: Secondary | ICD-10-CM | POA: Diagnosis not present

## 2015-08-18 DIAGNOSIS — R51 Headache: Secondary | ICD-10-CM | POA: Diagnosis not present

## 2015-08-18 DIAGNOSIS — Z1389 Encounter for screening for other disorder: Secondary | ICD-10-CM | POA: Diagnosis not present

## 2015-08-18 DIAGNOSIS — Z0000 Encounter for general adult medical examination without abnormal findings: Secondary | ICD-10-CM | POA: Diagnosis not present

## 2015-08-18 DIAGNOSIS — Z9181 History of falling: Secondary | ICD-10-CM | POA: Diagnosis not present

## 2015-08-18 DIAGNOSIS — E785 Hyperlipidemia, unspecified: Secondary | ICD-10-CM | POA: Diagnosis not present

## 2015-08-18 DIAGNOSIS — Z23 Encounter for immunization: Secondary | ICD-10-CM | POA: Diagnosis not present

## 2015-09-21 DIAGNOSIS — K319 Disease of stomach and duodenum, unspecified: Secondary | ICD-10-CM | POA: Diagnosis not present

## 2015-09-21 DIAGNOSIS — K317 Polyp of stomach and duodenum: Secondary | ICD-10-CM | POA: Diagnosis not present

## 2015-09-21 DIAGNOSIS — Z8719 Personal history of other diseases of the digestive system: Secondary | ICD-10-CM | POA: Diagnosis not present

## 2015-09-21 DIAGNOSIS — K208 Other esophagitis: Secondary | ICD-10-CM | POA: Diagnosis not present

## 2015-09-21 DIAGNOSIS — K229 Disease of esophagus, unspecified: Secondary | ICD-10-CM | POA: Diagnosis not present

## 2015-09-21 DIAGNOSIS — K210 Gastro-esophageal reflux disease with esophagitis: Secondary | ICD-10-CM | POA: Diagnosis not present

## 2015-09-21 DIAGNOSIS — D131 Benign neoplasm of stomach: Secondary | ICD-10-CM | POA: Diagnosis not present

## 2015-10-11 DIAGNOSIS — K219 Gastro-esophageal reflux disease without esophagitis: Secondary | ICD-10-CM | POA: Diagnosis not present

## 2015-10-11 DIAGNOSIS — R1010 Upper abdominal pain, unspecified: Secondary | ICD-10-CM | POA: Diagnosis not present

## 2015-10-11 DIAGNOSIS — K2270 Barrett's esophagus without dysplasia: Secondary | ICD-10-CM | POA: Diagnosis not present

## 2015-12-31 DIAGNOSIS — L821 Other seborrheic keratosis: Secondary | ICD-10-CM | POA: Diagnosis not present

## 2015-12-31 DIAGNOSIS — R203 Hyperesthesia: Secondary | ICD-10-CM | POA: Diagnosis not present

## 2015-12-31 DIAGNOSIS — D225 Melanocytic nevi of trunk: Secondary | ICD-10-CM | POA: Diagnosis not present

## 2015-12-31 DIAGNOSIS — L309 Dermatitis, unspecified: Secondary | ICD-10-CM | POA: Diagnosis not present

## 2015-12-31 DIAGNOSIS — D1801 Hemangioma of skin and subcutaneous tissue: Secondary | ICD-10-CM | POA: Diagnosis not present

## 2015-12-31 DIAGNOSIS — L814 Other melanin hyperpigmentation: Secondary | ICD-10-CM | POA: Diagnosis not present

## 2016-06-27 DIAGNOSIS — L219 Seborrheic dermatitis, unspecified: Secondary | ICD-10-CM | POA: Diagnosis not present

## 2016-06-27 DIAGNOSIS — L720 Epidermal cyst: Secondary | ICD-10-CM | POA: Diagnosis not present

## 2016-06-27 DIAGNOSIS — D485 Neoplasm of uncertain behavior of skin: Secondary | ICD-10-CM | POA: Diagnosis not present

## 2016-06-27 DIAGNOSIS — L905 Scar conditions and fibrosis of skin: Secondary | ICD-10-CM | POA: Diagnosis not present

## 2016-08-22 DIAGNOSIS — R51 Headache: Secondary | ICD-10-CM | POA: Diagnosis not present

## 2016-08-22 DIAGNOSIS — Z125 Encounter for screening for malignant neoplasm of prostate: Secondary | ICD-10-CM | POA: Diagnosis not present

## 2016-08-22 DIAGNOSIS — E785 Hyperlipidemia, unspecified: Secondary | ICD-10-CM | POA: Diagnosis not present

## 2016-08-22 DIAGNOSIS — K219 Gastro-esophageal reflux disease without esophagitis: Secondary | ICD-10-CM | POA: Diagnosis not present

## 2016-08-22 DIAGNOSIS — Z9181 History of falling: Secondary | ICD-10-CM | POA: Diagnosis not present

## 2016-08-22 DIAGNOSIS — Z0000 Encounter for general adult medical examination without abnormal findings: Secondary | ICD-10-CM | POA: Diagnosis not present

## 2016-08-22 DIAGNOSIS — Z1389 Encounter for screening for other disorder: Secondary | ICD-10-CM | POA: Diagnosis not present

## 2016-08-22 DIAGNOSIS — Z6825 Body mass index (BMI) 25.0-25.9, adult: Secondary | ICD-10-CM | POA: Diagnosis not present

## 2016-08-22 DIAGNOSIS — F419 Anxiety disorder, unspecified: Secondary | ICD-10-CM | POA: Diagnosis not present

## 2016-10-16 DIAGNOSIS — K219 Gastro-esophageal reflux disease without esophagitis: Secondary | ICD-10-CM | POA: Diagnosis not present

## 2016-10-16 DIAGNOSIS — K2270 Barrett's esophagus without dysplasia: Secondary | ICD-10-CM | POA: Diagnosis not present

## 2017-01-01 DIAGNOSIS — L570 Actinic keratosis: Secondary | ICD-10-CM | POA: Diagnosis not present

## 2017-01-01 DIAGNOSIS — L301 Dyshidrosis [pompholyx]: Secondary | ICD-10-CM | POA: Diagnosis not present

## 2017-01-01 DIAGNOSIS — L821 Other seborrheic keratosis: Secondary | ICD-10-CM | POA: Diagnosis not present

## 2017-01-01 DIAGNOSIS — L820 Inflamed seborrheic keratosis: Secondary | ICD-10-CM | POA: Diagnosis not present

## 2017-01-01 DIAGNOSIS — D2239 Melanocytic nevi of other parts of face: Secondary | ICD-10-CM | POA: Diagnosis not present

## 2017-01-01 DIAGNOSIS — D235 Other benign neoplasm of skin of trunk: Secondary | ICD-10-CM | POA: Diagnosis not present

## 2017-01-01 DIAGNOSIS — D1801 Hemangioma of skin and subcutaneous tissue: Secondary | ICD-10-CM | POA: Diagnosis not present

## 2017-01-11 DIAGNOSIS — Z6826 Body mass index (BMI) 26.0-26.9, adult: Secondary | ICD-10-CM | POA: Diagnosis not present

## 2017-01-11 DIAGNOSIS — B029 Zoster without complications: Secondary | ICD-10-CM | POA: Diagnosis not present

## 2017-01-12 DIAGNOSIS — B0230 Zoster ocular disease, unspecified: Secondary | ICD-10-CM | POA: Diagnosis not present

## 2017-01-17 DIAGNOSIS — B0230 Zoster ocular disease, unspecified: Secondary | ICD-10-CM | POA: Diagnosis not present

## 2017-01-25 DIAGNOSIS — B0230 Zoster ocular disease, unspecified: Secondary | ICD-10-CM | POA: Diagnosis not present

## 2017-04-19 DIAGNOSIS — L309 Dermatitis, unspecified: Secondary | ICD-10-CM | POA: Diagnosis not present

## 2017-08-09 DIAGNOSIS — Z0000 Encounter for general adult medical examination without abnormal findings: Secondary | ICD-10-CM | POA: Diagnosis not present

## 2017-08-09 DIAGNOSIS — Z125 Encounter for screening for malignant neoplasm of prostate: Secondary | ICD-10-CM | POA: Diagnosis not present

## 2017-08-09 DIAGNOSIS — Z1389 Encounter for screening for other disorder: Secondary | ICD-10-CM | POA: Diagnosis not present

## 2017-08-09 DIAGNOSIS — Z9181 History of falling: Secondary | ICD-10-CM | POA: Diagnosis not present

## 2017-08-09 DIAGNOSIS — Z136 Encounter for screening for cardiovascular disorders: Secondary | ICD-10-CM | POA: Diagnosis not present

## 2017-08-09 DIAGNOSIS — E785 Hyperlipidemia, unspecified: Secondary | ICD-10-CM | POA: Diagnosis not present

## 2017-08-23 DIAGNOSIS — L719 Rosacea, unspecified: Secondary | ICD-10-CM | POA: Diagnosis not present

## 2017-08-23 DIAGNOSIS — Z1389 Encounter for screening for other disorder: Secondary | ICD-10-CM | POA: Diagnosis not present

## 2017-08-23 DIAGNOSIS — E785 Hyperlipidemia, unspecified: Secondary | ICD-10-CM | POA: Diagnosis not present

## 2017-08-23 DIAGNOSIS — F419 Anxiety disorder, unspecified: Secondary | ICD-10-CM | POA: Diagnosis not present

## 2017-08-23 DIAGNOSIS — Z125 Encounter for screening for malignant neoplasm of prostate: Secondary | ICD-10-CM | POA: Diagnosis not present

## 2017-08-23 DIAGNOSIS — Z9181 History of falling: Secondary | ICD-10-CM | POA: Diagnosis not present

## 2017-08-23 DIAGNOSIS — K219 Gastro-esophageal reflux disease without esophagitis: Secondary | ICD-10-CM | POA: Diagnosis not present

## 2017-08-23 DIAGNOSIS — K2270 Barrett's esophagus without dysplasia: Secondary | ICD-10-CM | POA: Diagnosis not present

## 2017-08-23 DIAGNOSIS — R51 Headache: Secondary | ICD-10-CM | POA: Diagnosis not present

## 2017-08-23 DIAGNOSIS — Z6826 Body mass index (BMI) 26.0-26.9, adult: Secondary | ICD-10-CM | POA: Diagnosis not present

## 2017-09-06 DIAGNOSIS — H40021 Open angle with borderline findings, high risk, right eye: Secondary | ICD-10-CM | POA: Diagnosis not present

## 2017-11-22 DIAGNOSIS — J019 Acute sinusitis, unspecified: Secondary | ICD-10-CM | POA: Diagnosis not present

## 2017-12-31 DIAGNOSIS — D225 Melanocytic nevi of trunk: Secondary | ICD-10-CM | POA: Diagnosis not present

## 2017-12-31 DIAGNOSIS — L821 Other seborrheic keratosis: Secondary | ICD-10-CM | POA: Diagnosis not present

## 2017-12-31 DIAGNOSIS — L814 Other melanin hyperpigmentation: Secondary | ICD-10-CM | POA: Diagnosis not present

## 2017-12-31 DIAGNOSIS — L918 Other hypertrophic disorders of the skin: Secondary | ICD-10-CM | POA: Diagnosis not present

## 2018-01-25 DIAGNOSIS — L905 Scar conditions and fibrosis of skin: Secondary | ICD-10-CM | POA: Diagnosis not present

## 2018-07-25 DIAGNOSIS — L218 Other seborrheic dermatitis: Secondary | ICD-10-CM | POA: Diagnosis not present

## 2018-07-25 DIAGNOSIS — L814 Other melanin hyperpigmentation: Secondary | ICD-10-CM | POA: Diagnosis not present

## 2018-07-25 DIAGNOSIS — D229 Melanocytic nevi, unspecified: Secondary | ICD-10-CM | POA: Diagnosis not present

## 2018-07-25 DIAGNOSIS — L821 Other seborrheic keratosis: Secondary | ICD-10-CM | POA: Diagnosis not present

## 2018-07-25 DIAGNOSIS — Z85828 Personal history of other malignant neoplasm of skin: Secondary | ICD-10-CM | POA: Diagnosis not present

## 2018-07-25 DIAGNOSIS — L578 Other skin changes due to chronic exposure to nonionizing radiation: Secondary | ICD-10-CM | POA: Diagnosis not present

## 2018-07-26 DIAGNOSIS — H11823 Conjunctivochalasis, bilateral: Secondary | ICD-10-CM | POA: Diagnosis not present

## 2018-07-31 DIAGNOSIS — Z800 Family history of malignant neoplasm of digestive organs: Secondary | ICD-10-CM | POA: Diagnosis not present

## 2018-07-31 DIAGNOSIS — K2270 Barrett's esophagus without dysplasia: Secondary | ICD-10-CM | POA: Diagnosis not present

## 2018-07-31 DIAGNOSIS — Z86010 Personal history of colonic polyps: Secondary | ICD-10-CM | POA: Diagnosis not present

## 2018-07-31 DIAGNOSIS — Z1211 Encounter for screening for malignant neoplasm of colon: Secondary | ICD-10-CM | POA: Diagnosis not present

## 2018-08-09 DIAGNOSIS — H11823 Conjunctivochalasis, bilateral: Secondary | ICD-10-CM | POA: Diagnosis not present

## 2018-08-15 DIAGNOSIS — K209 Esophagitis, unspecified: Secondary | ICD-10-CM | POA: Diagnosis not present

## 2018-08-15 DIAGNOSIS — K317 Polyp of stomach and duodenum: Secondary | ICD-10-CM | POA: Diagnosis not present

## 2018-08-15 DIAGNOSIS — Z86010 Personal history of colonic polyps: Secondary | ICD-10-CM | POA: Diagnosis not present

## 2018-08-15 DIAGNOSIS — K2950 Unspecified chronic gastritis without bleeding: Secondary | ICD-10-CM | POA: Diagnosis not present

## 2018-08-15 DIAGNOSIS — K2270 Barrett's esophagus without dysplasia: Secondary | ICD-10-CM | POA: Diagnosis not present

## 2018-08-15 DIAGNOSIS — Z1211 Encounter for screening for malignant neoplasm of colon: Secondary | ICD-10-CM | POA: Diagnosis not present

## 2018-08-15 DIAGNOSIS — K2930 Chronic superficial gastritis without bleeding: Secondary | ICD-10-CM | POA: Diagnosis not present

## 2018-08-15 DIAGNOSIS — K5730 Diverticulosis of large intestine without perforation or abscess without bleeding: Secondary | ICD-10-CM | POA: Diagnosis not present

## 2018-08-15 DIAGNOSIS — K3189 Other diseases of stomach and duodenum: Secondary | ICD-10-CM | POA: Diagnosis not present

## 2018-08-15 DIAGNOSIS — Z800 Family history of malignant neoplasm of digestive organs: Secondary | ICD-10-CM | POA: Diagnosis not present

## 2018-08-15 DIAGNOSIS — K649 Unspecified hemorrhoids: Secondary | ICD-10-CM | POA: Diagnosis not present

## 2018-08-15 DIAGNOSIS — K210 Gastro-esophageal reflux disease with esophagitis: Secondary | ICD-10-CM | POA: Diagnosis not present

## 2018-08-15 DIAGNOSIS — K449 Diaphragmatic hernia without obstruction or gangrene: Secondary | ICD-10-CM | POA: Diagnosis not present

## 2018-08-27 DIAGNOSIS — Z1331 Encounter for screening for depression: Secondary | ICD-10-CM | POA: Diagnosis not present

## 2018-08-27 DIAGNOSIS — F419 Anxiety disorder, unspecified: Secondary | ICD-10-CM | POA: Diagnosis not present

## 2018-08-27 DIAGNOSIS — Z9181 History of falling: Secondary | ICD-10-CM | POA: Diagnosis not present

## 2018-08-27 DIAGNOSIS — Z0000 Encounter for general adult medical examination without abnormal findings: Secondary | ICD-10-CM | POA: Diagnosis not present

## 2018-08-27 DIAGNOSIS — Z125 Encounter for screening for malignant neoplasm of prostate: Secondary | ICD-10-CM | POA: Diagnosis not present

## 2018-08-27 DIAGNOSIS — K219 Gastro-esophageal reflux disease without esophagitis: Secondary | ICD-10-CM | POA: Diagnosis not present

## 2018-08-27 DIAGNOSIS — E785 Hyperlipidemia, unspecified: Secondary | ICD-10-CM | POA: Diagnosis not present

## 2018-08-29 DIAGNOSIS — Z136 Encounter for screening for cardiovascular disorders: Secondary | ICD-10-CM | POA: Diagnosis not present

## 2018-08-29 DIAGNOSIS — Z1331 Encounter for screening for depression: Secondary | ICD-10-CM | POA: Diagnosis not present

## 2018-08-29 DIAGNOSIS — Z0000 Encounter for general adult medical examination without abnormal findings: Secondary | ICD-10-CM | POA: Diagnosis not present

## 2018-08-29 DIAGNOSIS — Z125 Encounter for screening for malignant neoplasm of prostate: Secondary | ICD-10-CM | POA: Diagnosis not present

## 2018-08-29 DIAGNOSIS — E785 Hyperlipidemia, unspecified: Secondary | ICD-10-CM | POA: Diagnosis not present

## 2018-08-29 DIAGNOSIS — Z9181 History of falling: Secondary | ICD-10-CM | POA: Diagnosis not present

## 2018-09-23 DIAGNOSIS — M7751 Other enthesopathy of right foot: Secondary | ICD-10-CM | POA: Diagnosis not present

## 2018-09-23 DIAGNOSIS — M19071 Primary osteoarthritis, right ankle and foot: Secondary | ICD-10-CM | POA: Diagnosis not present

## 2018-09-23 DIAGNOSIS — G5763 Lesion of plantar nerve, bilateral lower limbs: Secondary | ICD-10-CM | POA: Diagnosis not present

## 2018-09-23 DIAGNOSIS — M19072 Primary osteoarthritis, left ankle and foot: Secondary | ICD-10-CM | POA: Diagnosis not present

## 2018-09-23 DIAGNOSIS — M7752 Other enthesopathy of left foot: Secondary | ICD-10-CM | POA: Diagnosis not present

## 2018-10-08 DIAGNOSIS — H5203 Hypermetropia, bilateral: Secondary | ICD-10-CM | POA: Diagnosis not present

## 2018-10-08 DIAGNOSIS — H2513 Age-related nuclear cataract, bilateral: Secondary | ICD-10-CM | POA: Diagnosis not present

## 2018-10-08 DIAGNOSIS — H40021 Open angle with borderline findings, high risk, right eye: Secondary | ICD-10-CM | POA: Diagnosis not present

## 2018-10-28 DIAGNOSIS — J029 Acute pharyngitis, unspecified: Secondary | ICD-10-CM | POA: Diagnosis not present

## 2018-12-06 DIAGNOSIS — H401111 Primary open-angle glaucoma, right eye, mild stage: Secondary | ICD-10-CM | POA: Diagnosis not present

## 2018-12-06 DIAGNOSIS — H2513 Age-related nuclear cataract, bilateral: Secondary | ICD-10-CM | POA: Diagnosis not present

## 2018-12-06 DIAGNOSIS — H11823 Conjunctivochalasis, bilateral: Secondary | ICD-10-CM | POA: Diagnosis not present

## 2018-12-17 DIAGNOSIS — H11823 Conjunctivochalasis, bilateral: Secondary | ICD-10-CM | POA: Diagnosis not present

## 2019-01-21 DIAGNOSIS — H2513 Age-related nuclear cataract, bilateral: Secondary | ICD-10-CM | POA: Diagnosis not present

## 2019-01-21 DIAGNOSIS — H11823 Conjunctivochalasis, bilateral: Secondary | ICD-10-CM | POA: Diagnosis not present

## 2019-01-21 DIAGNOSIS — H401111 Primary open-angle glaucoma, right eye, mild stage: Secondary | ICD-10-CM | POA: Diagnosis not present

## 2019-01-27 DIAGNOSIS — L821 Other seborrheic keratosis: Secondary | ICD-10-CM | POA: Diagnosis not present

## 2019-01-27 DIAGNOSIS — H2513 Age-related nuclear cataract, bilateral: Secondary | ICD-10-CM | POA: Diagnosis not present

## 2019-01-27 DIAGNOSIS — H2511 Age-related nuclear cataract, right eye: Secondary | ICD-10-CM | POA: Diagnosis not present

## 2019-01-27 DIAGNOSIS — L819 Disorder of pigmentation, unspecified: Secondary | ICD-10-CM | POA: Diagnosis not present

## 2019-01-27 DIAGNOSIS — Z85828 Personal history of other malignant neoplasm of skin: Secondary | ICD-10-CM | POA: Diagnosis not present

## 2019-01-27 DIAGNOSIS — D229 Melanocytic nevi, unspecified: Secondary | ICD-10-CM | POA: Diagnosis not present

## 2019-01-27 DIAGNOSIS — H40013 Open angle with borderline findings, low risk, bilateral: Secondary | ICD-10-CM | POA: Diagnosis not present

## 2019-01-27 DIAGNOSIS — L570 Actinic keratosis: Secondary | ICD-10-CM | POA: Diagnosis not present

## 2019-01-27 DIAGNOSIS — L814 Other melanin hyperpigmentation: Secondary | ICD-10-CM | POA: Diagnosis not present

## 2019-01-29 DIAGNOSIS — Z86010 Personal history of colonic polyps: Secondary | ICD-10-CM | POA: Diagnosis not present

## 2019-01-29 DIAGNOSIS — K219 Gastro-esophageal reflux disease without esophagitis: Secondary | ICD-10-CM | POA: Diagnosis not present

## 2019-01-29 DIAGNOSIS — K2270 Barrett's esophagus without dysplasia: Secondary | ICD-10-CM | POA: Diagnosis not present

## 2019-01-29 DIAGNOSIS — Z800 Family history of malignant neoplasm of digestive organs: Secondary | ICD-10-CM | POA: Diagnosis not present

## 2019-02-19 DIAGNOSIS — H40013 Open angle with borderline findings, low risk, bilateral: Secondary | ICD-10-CM | POA: Diagnosis not present

## 2019-02-19 DIAGNOSIS — H04123 Dry eye syndrome of bilateral lacrimal glands: Secondary | ICD-10-CM | POA: Diagnosis not present

## 2019-02-19 DIAGNOSIS — H25013 Cortical age-related cataract, bilateral: Secondary | ICD-10-CM | POA: Diagnosis not present

## 2019-02-19 DIAGNOSIS — H2513 Age-related nuclear cataract, bilateral: Secondary | ICD-10-CM | POA: Diagnosis not present

## 2019-03-12 DIAGNOSIS — B349 Viral infection, unspecified: Secondary | ICD-10-CM | POA: Diagnosis not present

## 2019-03-12 DIAGNOSIS — R6889 Other general symptoms and signs: Secondary | ICD-10-CM | POA: Diagnosis not present

## 2019-03-17 DIAGNOSIS — R05 Cough: Secondary | ICD-10-CM | POA: Diagnosis not present

## 2019-03-17 DIAGNOSIS — R509 Fever, unspecified: Secondary | ICD-10-CM | POA: Diagnosis not present

## 2019-03-17 DIAGNOSIS — J189 Pneumonia, unspecified organism: Secondary | ICD-10-CM | POA: Diagnosis not present

## 2019-03-17 DIAGNOSIS — J181 Lobar pneumonia, unspecified organism: Secondary | ICD-10-CM | POA: Diagnosis not present

## 2019-03-20 DIAGNOSIS — K219 Gastro-esophageal reflux disease without esophagitis: Secondary | ICD-10-CM | POA: Diagnosis not present

## 2019-09-01 DIAGNOSIS — H25013 Cortical age-related cataract, bilateral: Secondary | ICD-10-CM | POA: Diagnosis not present

## 2019-09-01 DIAGNOSIS — H2511 Age-related nuclear cataract, right eye: Secondary | ICD-10-CM | POA: Diagnosis not present

## 2019-09-01 DIAGNOSIS — H04123 Dry eye syndrome of bilateral lacrimal glands: Secondary | ICD-10-CM | POA: Diagnosis not present

## 2019-09-01 DIAGNOSIS — H2513 Age-related nuclear cataract, bilateral: Secondary | ICD-10-CM | POA: Diagnosis not present

## 2019-09-01 DIAGNOSIS — H25043 Posterior subcapsular polar age-related cataract, bilateral: Secondary | ICD-10-CM | POA: Diagnosis not present

## 2019-09-01 DIAGNOSIS — H40013 Open angle with borderline findings, low risk, bilateral: Secondary | ICD-10-CM | POA: Diagnosis not present

## 2019-09-03 DIAGNOSIS — H0102A Squamous blepharitis right eye, upper and lower eyelids: Secondary | ICD-10-CM | POA: Diagnosis not present

## 2019-09-03 DIAGNOSIS — Z1331 Encounter for screening for depression: Secondary | ICD-10-CM | POA: Diagnosis not present

## 2019-09-03 DIAGNOSIS — Z139 Encounter for screening, unspecified: Secondary | ICD-10-CM | POA: Diagnosis not present

## 2019-09-03 DIAGNOSIS — L719 Rosacea, unspecified: Secondary | ICD-10-CM | POA: Diagnosis not present

## 2019-09-03 DIAGNOSIS — H0102B Squamous blepharitis left eye, upper and lower eyelids: Secondary | ICD-10-CM | POA: Diagnosis not present

## 2019-09-03 DIAGNOSIS — Z125 Encounter for screening for malignant neoplasm of prostate: Secondary | ICD-10-CM | POA: Diagnosis not present

## 2019-09-03 DIAGNOSIS — E785 Hyperlipidemia, unspecified: Secondary | ICD-10-CM | POA: Diagnosis not present

## 2019-09-03 DIAGNOSIS — Z0000 Encounter for general adult medical examination without abnormal findings: Secondary | ICD-10-CM | POA: Diagnosis not present

## 2019-09-03 DIAGNOSIS — Z9181 History of falling: Secondary | ICD-10-CM | POA: Diagnosis not present

## 2019-09-03 DIAGNOSIS — H2513 Age-related nuclear cataract, bilateral: Secondary | ICD-10-CM | POA: Diagnosis not present

## 2019-09-11 DIAGNOSIS — Z125 Encounter for screening for malignant neoplasm of prostate: Secondary | ICD-10-CM | POA: Diagnosis not present

## 2019-09-11 DIAGNOSIS — K2270 Barrett's esophagus without dysplasia: Secondary | ICD-10-CM | POA: Diagnosis not present

## 2019-09-11 DIAGNOSIS — E785 Hyperlipidemia, unspecified: Secondary | ICD-10-CM | POA: Diagnosis not present

## 2019-09-11 DIAGNOSIS — Z23 Encounter for immunization: Secondary | ICD-10-CM | POA: Diagnosis not present

## 2019-09-11 DIAGNOSIS — F419 Anxiety disorder, unspecified: Secondary | ICD-10-CM | POA: Diagnosis not present

## 2019-09-11 DIAGNOSIS — K219 Gastro-esophageal reflux disease without esophagitis: Secondary | ICD-10-CM | POA: Diagnosis not present

## 2019-09-19 DIAGNOSIS — H2513 Age-related nuclear cataract, bilateral: Secondary | ICD-10-CM | POA: Diagnosis not present

## 2019-09-19 DIAGNOSIS — H04123 Dry eye syndrome of bilateral lacrimal glands: Secondary | ICD-10-CM | POA: Diagnosis not present

## 2019-09-19 DIAGNOSIS — H40013 Open angle with borderline findings, low risk, bilateral: Secondary | ICD-10-CM | POA: Diagnosis not present

## 2019-09-19 DIAGNOSIS — H25043 Posterior subcapsular polar age-related cataract, bilateral: Secondary | ICD-10-CM | POA: Diagnosis not present

## 2019-09-29 DIAGNOSIS — H0102A Squamous blepharitis right eye, upper and lower eyelids: Secondary | ICD-10-CM | POA: Diagnosis not present

## 2019-09-29 DIAGNOSIS — H2513 Age-related nuclear cataract, bilateral: Secondary | ICD-10-CM | POA: Diagnosis not present

## 2019-09-29 DIAGNOSIS — H40013 Open angle with borderline findings, low risk, bilateral: Secondary | ICD-10-CM | POA: Diagnosis not present

## 2019-09-29 DIAGNOSIS — H0102B Squamous blepharitis left eye, upper and lower eyelids: Secondary | ICD-10-CM | POA: Diagnosis not present

## 2019-12-05 DIAGNOSIS — H269 Unspecified cataract: Secondary | ICD-10-CM

## 2019-12-05 HISTORY — DX: Unspecified cataract: H26.9

## 2019-12-19 DIAGNOSIS — H04123 Dry eye syndrome of bilateral lacrimal glands: Secondary | ICD-10-CM | POA: Diagnosis not present

## 2019-12-19 DIAGNOSIS — H2513 Age-related nuclear cataract, bilateral: Secondary | ICD-10-CM | POA: Diagnosis not present

## 2019-12-19 DIAGNOSIS — H25043 Posterior subcapsular polar age-related cataract, bilateral: Secondary | ICD-10-CM | POA: Diagnosis not present

## 2019-12-19 DIAGNOSIS — H2511 Age-related nuclear cataract, right eye: Secondary | ICD-10-CM | POA: Diagnosis not present

## 2019-12-19 DIAGNOSIS — H25013 Cortical age-related cataract, bilateral: Secondary | ICD-10-CM | POA: Diagnosis not present

## 2019-12-19 DIAGNOSIS — H40013 Open angle with borderline findings, low risk, bilateral: Secondary | ICD-10-CM | POA: Diagnosis not present

## 2020-01-15 DIAGNOSIS — H2511 Age-related nuclear cataract, right eye: Secondary | ICD-10-CM | POA: Diagnosis not present

## 2020-01-15 HISTORY — PX: CATARACT EXTRACTION: SUR2

## 2020-01-16 DIAGNOSIS — H25012 Cortical age-related cataract, left eye: Secondary | ICD-10-CM | POA: Diagnosis not present

## 2020-01-16 DIAGNOSIS — H2512 Age-related nuclear cataract, left eye: Secondary | ICD-10-CM | POA: Diagnosis not present

## 2020-01-16 DIAGNOSIS — H25042 Posterior subcapsular polar age-related cataract, left eye: Secondary | ICD-10-CM | POA: Diagnosis not present

## 2020-01-26 DIAGNOSIS — D2372 Other benign neoplasm of skin of left lower limb, including hip: Secondary | ICD-10-CM | POA: Diagnosis not present

## 2020-01-26 DIAGNOSIS — D225 Melanocytic nevi of trunk: Secondary | ICD-10-CM | POA: Diagnosis not present

## 2020-01-26 DIAGNOSIS — Z1283 Encounter for screening for malignant neoplasm of skin: Secondary | ICD-10-CM | POA: Diagnosis not present

## 2020-01-26 DIAGNOSIS — D2230 Melanocytic nevi of unspecified part of face: Secondary | ICD-10-CM | POA: Diagnosis not present

## 2020-01-26 DIAGNOSIS — D229 Melanocytic nevi, unspecified: Secondary | ICD-10-CM | POA: Diagnosis not present

## 2020-01-26 DIAGNOSIS — L821 Other seborrheic keratosis: Secondary | ICD-10-CM | POA: Diagnosis not present

## 2020-01-26 DIAGNOSIS — Z85828 Personal history of other malignant neoplasm of skin: Secondary | ICD-10-CM | POA: Diagnosis not present

## 2020-01-26 DIAGNOSIS — I781 Nevus, non-neoplastic: Secondary | ICD-10-CM | POA: Diagnosis not present

## 2020-01-26 DIAGNOSIS — L219 Seborrheic dermatitis, unspecified: Secondary | ICD-10-CM | POA: Diagnosis not present

## 2020-01-26 DIAGNOSIS — L578 Other skin changes due to chronic exposure to nonionizing radiation: Secondary | ICD-10-CM | POA: Diagnosis not present

## 2020-01-26 DIAGNOSIS — L570 Actinic keratosis: Secondary | ICD-10-CM | POA: Diagnosis not present

## 2020-01-26 DIAGNOSIS — L720 Epidermal cyst: Secondary | ICD-10-CM | POA: Diagnosis not present

## 2020-02-05 DIAGNOSIS — H2512 Age-related nuclear cataract, left eye: Secondary | ICD-10-CM | POA: Diagnosis not present

## 2020-02-05 DIAGNOSIS — H2511 Age-related nuclear cataract, right eye: Secondary | ICD-10-CM | POA: Diagnosis not present

## 2020-02-24 ENCOUNTER — Other Ambulatory Visit: Payer: Self-pay

## 2020-02-24 ENCOUNTER — Ambulatory Visit (INDEPENDENT_AMBULATORY_CARE_PROVIDER_SITE_OTHER): Payer: Medicare Other | Admitting: Dermatology

## 2020-02-24 ENCOUNTER — Telehealth: Payer: Self-pay

## 2020-02-24 DIAGNOSIS — L720 Epidermal cyst: Secondary | ICD-10-CM | POA: Diagnosis not present

## 2020-02-24 DIAGNOSIS — D492 Neoplasm of unspecified behavior of bone, soft tissue, and skin: Secondary | ICD-10-CM

## 2020-02-24 MED ORDER — MUPIROCIN 2 % EX OINT: 1.0000 "application " | TOPICAL_OINTMENT | Freq: Every day | CUTANEOUS | 0 refills | Status: AC

## 2020-02-24 NOTE — Patient Instructions (Signed)

## 2020-02-24 NOTE — Telephone Encounter (Signed)
Pt doing fine after today's surgery./sh 

## 2020-02-24 NOTE — Progress Notes (Signed)
   Follow-Up Visit   Subjective  Mark Valencia is a 74 y.o. male who presents for the following: surgery today Cyst vs Neurofibroma (L of midline anterior waistline).   The following portions of the chart were reviewed this encounter and updated as appropriate:     Review of Systems: No other skin or systemic complaints.  Objective  Well appearing patient in no apparent distress; mood and affect are within normal limits.  A focused examination was performed including L of midline anterior waistline. Relevant physical exam findings are noted in the Assessment and Plan.  Objective  L of midline anterior waistline: Flesh colored to pink pap 1.3 x 1.0cm  Assessment & Plan  Neoplasm of skin L of midline anterior waistline  Skin excision  Lesion length (cm):  1.3 Lesion width (cm):  1 Margin per side (cm):  0.2 Total excision diameter (cm):  1.7 Informed consent: discussed and consent obtained   Timeout: patient name, date of birth, surgical site, and procedure verified   Procedure prep:  Patient was prepped and draped in usual sterile fashion Prep type:  Isopropyl alcohol and povidone-iodine Anesthesia: the lesion was anesthetized in a standard fashion   Anesthetic:  1% lidocaine w/ epinephrine 1-100,000 buffered w/ 8.4% NaHCO3 (8cc) Instrument used: #15 blade   Hemostasis achieved with: pressure   Hemostasis achieved with comment:  Electrocautery Outcome: patient tolerated procedure well with no complications   Post-procedure details: sterile dressing applied and wound care instructions given   Dressing type: bandage and pressure dressing (Mupirocin)    Skin repair Complexity:  Complex Final length (cm):  2.7 Reason for type of repair: reduce tension to allow closure, reduce the risk of dehiscence, infection, and necrosis, reduce subcutaneous dead space and avoid a hematoma, allow closure of the large defect, preserve normal anatomy, preserve normal anatomical and functional  relationships and enhance both functionality and cosmetic results   Undermining: area extensively undermined   Undermining comment:  Undermining Defect 1.2 cm Subcutaneous layers (deep stitches):  Suture size:  4-0 Suture type: Vicryl (polyglactin 910)   Subcutaneous suture technique: Inverted Dermal. Fine/surface layer approximation (top stitches):  Suture size:  4-0 Stitches: simple running   Stitches comment:  Nylon Suture removal (days):  7 Hemostasis achieved with: suture and pressure Outcome: patient tolerated procedure well with no complications   Post-procedure details: sterile dressing applied and wound care instructions given   Dressing type: bandage and pressure dressing (Mupirocin)    mupirocin ointment (BACTROBAN) 2 %  Specimen 1 - Surgical pathology Differential Diagnosis: D48.5 Neurofibroma vs Cyst Check Margins: yes Flesh colored to pink pap 1.3 x 1.0cm   Return in about 1 week (around 03/02/2020) for suture removal.

## 2020-02-27 ENCOUNTER — Telehealth: Payer: Self-pay

## 2020-02-27 NOTE — Telephone Encounter (Signed)
Patient informed of pathology results 

## 2020-03-02 ENCOUNTER — Other Ambulatory Visit: Payer: Self-pay

## 2020-03-02 ENCOUNTER — Telehealth: Payer: Self-pay

## 2020-03-02 ENCOUNTER — Encounter: Payer: Self-pay | Admitting: Dermatology

## 2020-03-02 ENCOUNTER — Ambulatory Visit (INDEPENDENT_AMBULATORY_CARE_PROVIDER_SITE_OTHER): Payer: Medicare Other | Admitting: Dermatology

## 2020-03-02 DIAGNOSIS — L720 Epidermal cyst: Secondary | ICD-10-CM

## 2020-03-02 NOTE — Patient Instructions (Signed)
After Suture Removal ° °1. After sutures are removed, the wound should be coated with an antibiotic ointment (eg. Polysporin, Bacitracin) and, if possible, kept covered with a Band-Aid or bandage for an additional 24 hours.  After that, no additional wound care is generally needed.  °2. It is alright to get the area wet. °3. If a skin cancer was removed, your skin should be re-examined in approximately three months. ° ° ° °

## 2020-03-02 NOTE — Progress Notes (Signed)
   Follow-Up Visit   Subjective  Mark Valencia is a 74 y.o. male who presents for the following: post op./suture removal (Patient states that excision site has been healing well other than rubbing against his clothing.).  The following portions of the chart were reviewed this encounter and updated as appropriate:    Review of Systems: No other skin or systemic complaints.  Objective  Well appearing patient in no apparent distress; mood and affect are within normal limits.  A focused examination was performed including the L ant waistline. Relevant physical exam findings are noted in the Assessment and Plan.  Objective  L ant waistline: Healing excision site  Assessment & Plan  Epidermoid cyst L ant waistline  Sutures removed, steri strips applied. Patient informed of pathology results.   Return in about 1 year (around 03/02/2021) for TBSE.   Tanya Nones, CMA, am acting as scribe for Sarina Ser, MD .

## 2020-03-02 NOTE — Telephone Encounter (Signed)
Patient informed of pathology results 

## 2020-08-19 DIAGNOSIS — H401130 Primary open-angle glaucoma, bilateral, stage unspecified: Secondary | ICD-10-CM | POA: Diagnosis not present

## 2020-08-19 DIAGNOSIS — Z961 Presence of intraocular lens: Secondary | ICD-10-CM | POA: Diagnosis not present

## 2020-08-19 DIAGNOSIS — H401131 Primary open-angle glaucoma, bilateral, mild stage: Secondary | ICD-10-CM | POA: Diagnosis not present

## 2020-08-19 DIAGNOSIS — H04123 Dry eye syndrome of bilateral lacrimal glands: Secondary | ICD-10-CM | POA: Diagnosis not present

## 2020-09-10 DIAGNOSIS — Z125 Encounter for screening for malignant neoplasm of prostate: Secondary | ICD-10-CM | POA: Diagnosis not present

## 2020-09-10 DIAGNOSIS — G8929 Other chronic pain: Secondary | ICD-10-CM | POA: Diagnosis not present

## 2020-09-10 DIAGNOSIS — Z1331 Encounter for screening for depression: Secondary | ICD-10-CM | POA: Diagnosis not present

## 2020-09-10 DIAGNOSIS — Z139 Encounter for screening, unspecified: Secondary | ICD-10-CM | POA: Diagnosis not present

## 2020-09-10 DIAGNOSIS — L719 Rosacea, unspecified: Secondary | ICD-10-CM | POA: Diagnosis not present

## 2020-09-10 DIAGNOSIS — R519 Headache, unspecified: Secondary | ICD-10-CM | POA: Diagnosis not present

## 2020-09-10 DIAGNOSIS — K2270 Barrett's esophagus without dysplasia: Secondary | ICD-10-CM | POA: Diagnosis not present

## 2020-09-10 DIAGNOSIS — F419 Anxiety disorder, unspecified: Secondary | ICD-10-CM | POA: Diagnosis not present

## 2020-09-10 DIAGNOSIS — Z23 Encounter for immunization: Secondary | ICD-10-CM | POA: Diagnosis not present

## 2020-09-10 DIAGNOSIS — Z9181 History of falling: Secondary | ICD-10-CM | POA: Diagnosis not present

## 2020-09-10 DIAGNOSIS — E785 Hyperlipidemia, unspecified: Secondary | ICD-10-CM | POA: Diagnosis not present

## 2020-09-10 DIAGNOSIS — K219 Gastro-esophageal reflux disease without esophagitis: Secondary | ICD-10-CM | POA: Diagnosis not present

## 2020-09-29 DIAGNOSIS — E785 Hyperlipidemia, unspecified: Secondary | ICD-10-CM | POA: Diagnosis not present

## 2020-09-29 DIAGNOSIS — Z9181 History of falling: Secondary | ICD-10-CM | POA: Diagnosis not present

## 2020-09-29 DIAGNOSIS — Z1331 Encounter for screening for depression: Secondary | ICD-10-CM | POA: Diagnosis not present

## 2020-09-29 DIAGNOSIS — Z0000 Encounter for general adult medical examination without abnormal findings: Secondary | ICD-10-CM | POA: Diagnosis not present

## 2020-10-03 DIAGNOSIS — Z23 Encounter for immunization: Secondary | ICD-10-CM | POA: Diagnosis not present

## 2020-10-21 DIAGNOSIS — H401130 Primary open-angle glaucoma, bilateral, stage unspecified: Secondary | ICD-10-CM | POA: Diagnosis not present

## 2020-10-21 DIAGNOSIS — H04123 Dry eye syndrome of bilateral lacrimal glands: Secondary | ICD-10-CM | POA: Diagnosis not present

## 2020-10-21 DIAGNOSIS — Z961 Presence of intraocular lens: Secondary | ICD-10-CM | POA: Diagnosis not present

## 2021-03-03 ENCOUNTER — Encounter: Payer: Medicare Other | Admitting: Dermatology

## 2021-04-21 DIAGNOSIS — H04123 Dry eye syndrome of bilateral lacrimal glands: Secondary | ICD-10-CM | POA: Diagnosis not present

## 2021-04-21 DIAGNOSIS — H401130 Primary open-angle glaucoma, bilateral, stage unspecified: Secondary | ICD-10-CM | POA: Diagnosis not present

## 2021-04-21 DIAGNOSIS — Z961 Presence of intraocular lens: Secondary | ICD-10-CM | POA: Diagnosis not present

## 2021-09-12 DIAGNOSIS — Z23 Encounter for immunization: Secondary | ICD-10-CM | POA: Diagnosis not present

## 2021-09-12 DIAGNOSIS — E785 Hyperlipidemia, unspecified: Secondary | ICD-10-CM | POA: Diagnosis not present

## 2021-09-12 DIAGNOSIS — Z6821 Body mass index (BMI) 21.0-21.9, adult: Secondary | ICD-10-CM | POA: Diagnosis not present

## 2021-09-12 DIAGNOSIS — K219 Gastro-esophageal reflux disease without esophagitis: Secondary | ICD-10-CM | POA: Diagnosis not present

## 2021-09-12 DIAGNOSIS — Z125 Encounter for screening for malignant neoplasm of prostate: Secondary | ICD-10-CM | POA: Diagnosis not present

## 2021-09-12 DIAGNOSIS — K2270 Barrett's esophagus without dysplasia: Secondary | ICD-10-CM | POA: Diagnosis not present

## 2021-09-12 DIAGNOSIS — F419 Anxiety disorder, unspecified: Secondary | ICD-10-CM | POA: Diagnosis not present

## 2021-09-12 DIAGNOSIS — G8929 Other chronic pain: Secondary | ICD-10-CM | POA: Diagnosis not present

## 2021-09-12 DIAGNOSIS — R519 Headache, unspecified: Secondary | ICD-10-CM | POA: Diagnosis not present

## 2021-09-13 ENCOUNTER — Other Ambulatory Visit: Payer: Self-pay

## 2021-09-13 ENCOUNTER — Ambulatory Visit (INDEPENDENT_AMBULATORY_CARE_PROVIDER_SITE_OTHER): Payer: Medicare Other | Admitting: Dermatology

## 2021-09-13 DIAGNOSIS — D229 Melanocytic nevi, unspecified: Secondary | ICD-10-CM

## 2021-09-13 DIAGNOSIS — L570 Actinic keratosis: Secondary | ICD-10-CM | POA: Diagnosis not present

## 2021-09-13 DIAGNOSIS — I8393 Asymptomatic varicose veins of bilateral lower extremities: Secondary | ICD-10-CM

## 2021-09-13 DIAGNOSIS — L578 Other skin changes due to chronic exposure to nonionizing radiation: Secondary | ICD-10-CM | POA: Diagnosis not present

## 2021-09-13 DIAGNOSIS — D1800 Hemangioma unspecified site: Secondary | ICD-10-CM | POA: Diagnosis not present

## 2021-09-13 DIAGNOSIS — Z1283 Encounter for screening for malignant neoplasm of skin: Secondary | ICD-10-CM | POA: Diagnosis not present

## 2021-09-13 DIAGNOSIS — L821 Other seborrheic keratosis: Secondary | ICD-10-CM

## 2021-09-13 DIAGNOSIS — L814 Other melanin hyperpigmentation: Secondary | ICD-10-CM | POA: Diagnosis not present

## 2021-09-13 DIAGNOSIS — Z86018 Personal history of other benign neoplasm: Secondary | ICD-10-CM | POA: Diagnosis not present

## 2021-09-13 DIAGNOSIS — L720 Epidermal cyst: Secondary | ICD-10-CM | POA: Diagnosis not present

## 2021-09-13 DIAGNOSIS — Z85828 Personal history of other malignant neoplasm of skin: Secondary | ICD-10-CM

## 2021-09-13 NOTE — Patient Instructions (Signed)

## 2021-09-13 NOTE — Progress Notes (Signed)
   Follow-Up Visit   Subjective  Mark Valencia is a 75 y.o. male who presents for the following: Annual Exam (History of BCC - TBSE today). The patient presents for Total-Body Skin Exam (TBSE) for skin cancer screening and mole check.  The following portions of the chart were reviewed this encounter and updated as appropriate:   Tobacco  Allergies  Meds  Problems  Med Hx  Surg Hx  Fam Hx     Review of Systems:  No other skin or systemic complaints except as noted in HPI or Assessment and Plan.  Objective  Well appearing patient in no apparent distress; mood and affect are within normal limits.  A full examination was performed including scalp, head, eyes, ears, nose, lips, neck, chest, axillae, abdomen, back, buttocks, bilateral upper extremities, bilateral lower extremities, hands, feet, fingers, toes, fingernails, and toenails. All findings within normal limits unless otherwise noted below.  Face (3) Erythematous thin papules/macules with gritty scale.   Legs Varicose veins  Right Temple Subcutaneous nodule.    Assessment & Plan   History of Basal Cell Carcinoma of the Skin - No evidence of recurrence today - Recommend regular full body skin exams - Recommend daily broad spectrum sunscreen SPF 30+ to sun-exposed areas, reapply every 2 hours as needed.  - Call if any new or changing lesions are noted between office visits  Hx of Neuroma of feet - follow up with Podiatry  Lentigines - Scattered tan macules - Due to sun exposure - Benign-appearing, observe - Recommend daily broad spectrum sunscreen SPF 30+ to sun-exposed areas, reapply every 2 hours as needed. - Call for any changes  Seborrheic Keratoses - Stuck-on, waxy, tan-brown papules and/or plaques  - Benign-appearing - Discussed benign etiology and prognosis. - Observe - Call for any changes  Melanocytic Nevi - Tan-brown and/or pink-flesh-colored symmetric macules and papules - Benign appearing on exam  today - Observation - Call clinic for new or changing moles - Recommend daily use of broad spectrum spf 30+ sunscreen to sun-exposed areas.   Hemangiomas - Red papules - Discussed benign nature - Observe - Call for any changes  Actinic Damage - Chronic condition, secondary to cumulative UV/sun exposure - diffuse scaly erythematous macules with underlying dyspigmentation - Recommend daily broad spectrum sunscreen SPF 30+ to sun-exposed areas, reapply every 2 hours as needed.  - Staying in the shade or wearing long sleeves, sun glasses (UVA+UVB protection) and wide brim hats (4-inch brim around the entire circumference of the hat) are also recommended for sun protection.  - Call for new or changing lesions.  Skin cancer screening performed today.  AK (actinic keratosis) (3) Face  Destruction of lesion - Face Complexity: simple   Destruction method: cryotherapy   Informed consent: discussed and consent obtained   Timeout:  patient name, date of birth, surgical site, and procedure verified Lesion destroyed using liquid nitrogen: Yes   Region frozen until ice ball extended beyond lesion: Yes   Outcome: patient tolerated procedure well with no complications   Post-procedure details: wound care instructions given    Asymptomatic varicose veins of both lower extremities Legs  Epidermal inclusion cyst Right Temple  Benign, observe.    Skin cancer screening  Return in about 1 year (around 09/13/2022) for TBSE.  I, Ashok Cordia, CMA, am acting as scribe for Sarina Ser, MD . Documentation: I have reviewed the above documentation for accuracy and completeness, and I agree with the above.  Sarina Ser, MD

## 2021-09-14 ENCOUNTER — Encounter: Payer: Self-pay | Admitting: Dermatology

## 2021-09-19 ENCOUNTER — Ambulatory Visit (INDEPENDENT_AMBULATORY_CARE_PROVIDER_SITE_OTHER): Payer: Medicare Other | Admitting: Dermatology

## 2021-09-19 ENCOUNTER — Other Ambulatory Visit: Payer: Self-pay

## 2021-09-19 DIAGNOSIS — L578 Other skin changes due to chronic exposure to nonionizing radiation: Secondary | ICD-10-CM

## 2021-09-19 DIAGNOSIS — L821 Other seborrheic keratosis: Secondary | ICD-10-CM

## 2021-09-19 DIAGNOSIS — L820 Inflamed seborrheic keratosis: Secondary | ICD-10-CM

## 2021-09-19 NOTE — Patient Instructions (Signed)

## 2021-09-19 NOTE — Progress Notes (Signed)
   Follow-Up Visit   Subjective  Mark Valencia is a 75 y.o. male who presents for the following: Lesions (On the scalp - patient would like then removed today). He states they itch and are traumatized.  The following portions of the chart were reviewed this encounter and updated as appropriate:   Tobacco  Allergies  Meds  Problems  Med Hx  Surg Hx  Fam Hx     Review of Systems:  No other skin or systemic complaints except as noted in HPI or Assessment and Plan.  Objective  Well appearing patient in no apparent distress; mood and affect are within normal limits.  A focused examination was performed including the scalp. Relevant physical exam findings are noted in the Assessment and Plan.  Scalp x 17 (17) Erythematous keratotic or waxy stuck-on papule or plaque.    Assessment & Plan  Inflamed seborrheic keratosis Scalp x 17  Destruction of lesion - Scalp x 17 Complexity: simple   Destruction method: cryotherapy   Informed consent: discussed and consent obtained   Timeout:  patient name, date of birth, surgical site, and procedure verified Lesion destroyed using liquid nitrogen: Yes   Region frozen until ice ball extended beyond lesion: Yes   Outcome: patient tolerated procedure well with no complications   Post-procedure details: wound care instructions given    Seborrheic Keratoses - Stuck-on, waxy, tan-brown papules and/or plaques  - Benign-appearing - Discussed benign etiology and prognosis. - Observe - Call for any changes  Actinic Damage - chronic, secondary to cumulative UV radiation exposure/sun exposure over time - diffuse scaly erythematous macules with underlying dyspigmentation - Recommend daily broad spectrum sunscreen SPF 30+ to sun-exposed areas, reapply every 2 hours as needed.  - Recommend staying in the shade or wearing long sleeves, sun glasses (UVA+UVB protection) and wide brim hats (4-inch brim around the entire circumference of the hat). - Call  for new or changing lesions.  Return for appointment as scheduled.  Luther Redo, CMA, am acting as scribe for Sarina Ser, MD . Documentation: I have reviewed the above documentation for accuracy and completeness, and I agree with the above.  Sarina Ser, MD

## 2021-09-20 ENCOUNTER — Encounter: Payer: Self-pay | Admitting: Dermatology

## 2021-09-27 DIAGNOSIS — R079 Chest pain, unspecified: Secondary | ICD-10-CM | POA: Diagnosis not present

## 2021-09-27 DIAGNOSIS — K219 Gastro-esophageal reflux disease without esophagitis: Secondary | ICD-10-CM | POA: Diagnosis not present

## 2021-09-27 DIAGNOSIS — I249 Acute ischemic heart disease, unspecified: Secondary | ICD-10-CM | POA: Diagnosis not present

## 2021-10-03 DIAGNOSIS — Z0000 Encounter for general adult medical examination without abnormal findings: Secondary | ICD-10-CM | POA: Diagnosis not present

## 2021-10-03 DIAGNOSIS — Z1331 Encounter for screening for depression: Secondary | ICD-10-CM | POA: Diagnosis not present

## 2021-10-03 DIAGNOSIS — Z9181 History of falling: Secondary | ICD-10-CM | POA: Diagnosis not present

## 2021-10-03 DIAGNOSIS — E785 Hyperlipidemia, unspecified: Secondary | ICD-10-CM | POA: Diagnosis not present

## 2021-10-03 DIAGNOSIS — Z139 Encounter for screening, unspecified: Secondary | ICD-10-CM | POA: Diagnosis not present

## 2021-10-20 DIAGNOSIS — K219 Gastro-esophageal reflux disease without esophagitis: Secondary | ICD-10-CM | POA: Diagnosis not present

## 2021-10-20 DIAGNOSIS — R0789 Other chest pain: Secondary | ICD-10-CM | POA: Diagnosis not present

## 2021-10-20 DIAGNOSIS — Z800 Family history of malignant neoplasm of digestive organs: Secondary | ICD-10-CM | POA: Diagnosis not present

## 2021-10-20 DIAGNOSIS — Z86010 Personal history of colonic polyps: Secondary | ICD-10-CM | POA: Diagnosis not present

## 2021-10-23 DIAGNOSIS — Z20828 Contact with and (suspected) exposure to other viral communicable diseases: Secondary | ICD-10-CM | POA: Diagnosis not present

## 2021-11-23 DIAGNOSIS — H04123 Dry eye syndrome of bilateral lacrimal glands: Secondary | ICD-10-CM | POA: Diagnosis not present

## 2021-11-23 DIAGNOSIS — H401130 Primary open-angle glaucoma, bilateral, stage unspecified: Secondary | ICD-10-CM | POA: Diagnosis not present

## 2021-11-23 DIAGNOSIS — Z961 Presence of intraocular lens: Secondary | ICD-10-CM | POA: Diagnosis not present

## 2021-12-26 ENCOUNTER — Telehealth: Payer: Self-pay

## 2021-12-26 NOTE — Telephone Encounter (Signed)
Pt called, he had spots on his scalp treated with LN2 3 months ago and they are not gone away,  okay return to the office for recheck

## 2022-01-09 DIAGNOSIS — Z20822 Contact with and (suspected) exposure to covid-19: Secondary | ICD-10-CM | POA: Diagnosis not present

## 2022-01-30 DIAGNOSIS — Z20822 Contact with and (suspected) exposure to covid-19: Secondary | ICD-10-CM | POA: Diagnosis not present

## 2022-01-31 DIAGNOSIS — J0120 Acute ethmoidal sinusitis, unspecified: Secondary | ICD-10-CM | POA: Diagnosis not present

## 2022-02-01 ENCOUNTER — Ambulatory Visit (INDEPENDENT_AMBULATORY_CARE_PROVIDER_SITE_OTHER): Payer: Medicare Other | Admitting: Dermatology

## 2022-02-01 ENCOUNTER — Other Ambulatory Visit: Payer: Self-pay

## 2022-02-01 DIAGNOSIS — L821 Other seborrheic keratosis: Secondary | ICD-10-CM

## 2022-02-01 DIAGNOSIS — L820 Inflamed seborrheic keratosis: Secondary | ICD-10-CM

## 2022-02-01 DIAGNOSIS — L578 Other skin changes due to chronic exposure to nonionizing radiation: Secondary | ICD-10-CM | POA: Diagnosis not present

## 2022-02-01 NOTE — Progress Notes (Signed)
? ?  Follow-Up Visit ?  ?Subjective  ?Mark Valencia is a 76 y.o. male who presents for the following: Other (Spot on post scalp that did not come off after LN2 treatment in October). ?The patient has spots, moles and lesions to be evaluated, some may be new or changing and the patient has concerns that these could be cancer. ? ?The following portions of the chart were reviewed this encounter and updated as appropriate:  ? Tobacco  Allergies  Meds  Problems  Med Hx  Surg Hx  Fam Hx   ?  ?Review of Systems:  No other skin or systemic complaints except as noted in HPI or Assessment and Plan. ? ?Objective  ?Well appearing patient in no apparent distress; mood and affect are within normal limits. ? ?A focused examination was performed including scalp. Relevant physical exam findings are noted in the Assessment and Plan. ? ?post scalp ?Erythematous stuck-on, waxy papule or plaque with hyperkeratosis and impetiginization ? ? ?Assessment & Plan  ? ?Actinic Damage ?- chronic, secondary to cumulative UV radiation exposure/sun exposure over time ?- diffuse scaly erythematous macules with underlying dyspigmentation ?- Recommend daily broad spectrum sunscreen SPF 30+ to sun-exposed areas, reapply every 2 hours as needed.  ?- Recommend staying in the shade or wearing long sleeves, sun glasses (UVA+UVB protection) and wide brim hats (4-inch brim around the entire circumference of the hat). ?- Call for new or changing lesions. ? ?Seborrheic Keratoses ?- Stuck-on, waxy, tan-brown papules and/or plaques  ?- Benign-appearing ?- Discussed benign etiology and prognosis. ?- Observe ?- Call for any changes ? ?Inflamed seborrheic keratosis with bacterial colonization / impetiginization / impetigo ?post scalp ? ?Today, start daily-Wash area with CLN Hand Wash - samples given ?After 4 weeks, start Duobrii cream qd if any residual - samples given.  ? ?Destruction of lesion - post scalp ?Complexity: simple   ?Destruction method:  cryotherapy   ?Informed consent: discussed and consent obtained   ?Timeout:  patient name, date of birth, surgical site, and procedure verified ?Lesion destroyed using liquid nitrogen: Yes   ?Region frozen until ice ball extended beyond lesion: Yes   ?Outcome: patient tolerated procedure well with no complications   ?Post-procedure details: wound care instructions given   ? ?Return in about 3 months (around 05/04/2022) for ISK follow up. ? ?I, Ashok Cordia, CMA, am acting as scribe for Sarina Ser, MD . ?Documentation: I have reviewed the above documentation for accuracy and completeness, and I agree with the above. ? ?Sarina Ser, MD ? ?

## 2022-02-01 NOTE — Patient Instructions (Addendum)
Cryotherapy Aftercare ? ?Wash gently with soap and water everyday.   ?Apply Vaseline and Band-Aid daily until healed.  ? ?In 4 weeks, start Duobrii cream once daily if needed - samples given today.  ? ?Wash area with CLN Hand wash - samples given ? ? ? ?If You Need Anything After Your Visit ? ?If you have any questions or concerns for your doctor, please call our main line at [phone] and press option 4 to reach your doctor's medical assistant. If no one answers, please leave a voicemail as directed and we will return your call as soon as possible. Messages left after 4 pm will be answered the following business day.  ? ?You may also send [place] a message via MyChart. We typically respond to MyChart messages within 1-2 business days. ? ?For prescription refills, please ask your pharmacy to contact our office. Our fax number is [phone]. ? ?If you have an urgent issue when the clinic is closed that cannot wait until the next business day, you can page your doctor at the number below.   ? ?Please note that while we do our best to be available for urgent issues outside of office hours, we are not available 24/7.  ? ?If you have an urgent issue and are unable to reach [place], you may choose to seek medical care at your doctor's office, retail clinic, urgent care center, or emergency room. ? ?If you have a medical emergency, please immediately call 911 or go to the emergency department. ? ?Pager Numbers ? ?- Dr. Nehemiah Massed: [phone] ? ?- Dr. Laurence Ferrari: [phone] ? ?- Dr. Nicole Kindred: [phone] ? ?In the event of inclement weather, please call our main line at [phone] for an update on the status of any delays or closures. ? ?Dermatology Medication Tips: ?Please keep the boxes that topical medications come in in order to help keep track of the instructions about where and how to use these. Pharmacies typically print the medication instructions only on the boxes and not directly on the medication tubes.  ? ?If your  medication is too expensive, please contact our office at [phone] option 4 or send [place] a message through [place].  ? ?We are unable to tell what your co-pay for medications will be in advance as this is different depending on your insurance coverage. However, we may be able to find a substitute medication at lower cost or fill out paperwork to get insurance to cover a needed medication.  ? ?If a prior authorization is required to get your medication covered by your insurance company, please allow [place] 1-2 business days to complete this process. ? ?Drug prices often vary depending on where the prescription is filled and some pharmacies may offer cheaper prices. ? ?The website www.goodrx.com contains coupons for medications through different pharmacies. The prices here do not account for what the cost may be with help from insurance (it may be cheaper with your insurance), but the website can give you the price if you did not use any insurance.  ?- You can print the associated coupon and take it with your prescription to the pharmacy.  ?- You may also stop by our office during regular business hours and pick up a GoodRx coupon card.  ?- If you need your prescription sent electronically to a different pharmacy, notify our office through [facility] or by phone at [phone] option 4. ? ? ? ? ?Si Usted Necesita Algo Despu?s de Su Visita ? ?Tambi?n puede enviarnos un mensaje a  trav?s de MyChart. Por lo general respondemos a los mensajes de MyChart en el transcurso de 1 a 2 d?as h?biles. ? ?Para renovar recetas, por favor pida a su farmacia que se ponga en contacto con nuestra oficina. Nuestro n?mero de fax es el [phone]. ? ?Si tiene un asunto urgente cuando la cl?nica est? cerrada y que no puede esperar hasta el siguiente d?a h?bil, puede llamar/localizar a su doctor(a) al n?mero que aparece a continuaci?n.  ? ?Por favor, tenga en cuenta que aunque hacemos todo lo posible para estar disponibles para  asuntos urgentes fuera del horario de oficina, no estamos disponibles las 24 horas del d?a, los 7 d?as de la semana.  ? ?Si tiene un problema urgente y no puede comunicarse con nosotros, puede optar por buscar atenci?n m?dica  en el consultorio de su doctor(a), en una cl?nica privada, en un centro de atenci?n urgente o en una sala de emergencias. ? ?Si tiene Engineer, maintenance (IT) m?dica, por favor llame inmediatamente al 911 o vaya a la sala de emergencias. ? ?N?meros de b?per ? ?- Dr. Nehemiah Massed: [phone] ? ?- Dra. Moye: [phone] ? ?- Dra. Nicole Kindred: [phone] ? ?En caso de inclemencias del tiempo, por favor llame a nuestra l?nea principal al [phone] para una actualizaci?n sobre el estado de cualquier retraso o cierre. ? ?Consejos para la medicaci?n en dermatolog?a: ?Por favor, guarde las cajas en las que vienen los medicamentos de uso t?pico para ayudarle a seguir las instrucciones sobre d?nde y c?mo usarlos. Las farmacias generalmente imprimen las instrucciones del medicamento s?lo en las cajas y no directamente en los tubos del [place].  ? ?Si su medicamento es muy caro, por favor, p?ngase en contacto con Zigmund Daniel llamando al [phone] y presione la opci?n 4 o env?enos un mensaje a trav?s de MyChart.  ? ?No podemos decirle cu?l ser? su copago por los medicamentos por adelantado ya que esto es diferente dependiendo de la cobertura de su seguro. Sin embargo, es posible que podamos encontrar un medicamento sustituto a Electrical engineer un formulario para que el seguro cubra el medicamento que se considera necesario.  ? ?Si se requiere Ardelia Mems autorizaci?n previa para que su compa??a de seguros [place] su medicamento, por favor perm?tanos de 1 a 2 d?as h?biles para completar este proceso. ? ?Los precios de los medicamentos var?an con frecuencia dependiendo del Environmental consultant de d?nde se surte la receta y alguna farmacias pueden ofrecer precios m?s baratos. ? ?El sitio web www.goodrx.com tiene cupones para  medicamentos de Airline pilot. Los precios aqu? no tienen en cuenta lo que podr?a costar con la ayuda del seguro (puede ser m?s barato con su seguro), pero el sitio web puede darle el precio si no utiliz? ning?n seguro.  ?- Puede imprimir el cup?n correspondiente y llevarlo con su receta a la farmacia.  ?- Tambi?n puede pasar por nuestra oficina durante el horario de atenci?n regular y recoger una tarjeta de cupones de GoodRx.  ?- Si necesita que su receta se env?e electr?nicamente a Chiropodist, informe a nuestra oficina a trav?s de MyChart de [place] o por tel?fono llamando al [phone] y presione la opci?n 4.  ?

## 2022-02-03 ENCOUNTER — Encounter: Payer: Self-pay | Admitting: Dermatology

## 2022-02-16 DIAGNOSIS — Z20822 Contact with and (suspected) exposure to covid-19: Secondary | ICD-10-CM | POA: Diagnosis not present

## 2022-03-15 DIAGNOSIS — Z20828 Contact with and (suspected) exposure to other viral communicable diseases: Secondary | ICD-10-CM | POA: Diagnosis not present

## 2022-03-17 DIAGNOSIS — Z20822 Contact with and (suspected) exposure to covid-19: Secondary | ICD-10-CM | POA: Diagnosis not present

## 2022-04-03 DIAGNOSIS — Z20822 Contact with and (suspected) exposure to covid-19: Secondary | ICD-10-CM | POA: Diagnosis not present

## 2022-05-04 ENCOUNTER — Ambulatory Visit (INDEPENDENT_AMBULATORY_CARE_PROVIDER_SITE_OTHER): Payer: Medicare Other | Admitting: Dermatology

## 2022-05-04 DIAGNOSIS — L0100 Impetigo, unspecified: Secondary | ICD-10-CM | POA: Diagnosis not present

## 2022-05-04 DIAGNOSIS — L820 Inflamed seborrheic keratosis: Secondary | ICD-10-CM | POA: Diagnosis not present

## 2022-05-04 NOTE — Progress Notes (Signed)
   Follow-Up Visit   Subjective  Mark Valencia is a 76 y.o. male who presents for the following: Follow-up (3 months f/u  post scalp ISK treated with LN2 7 months that became impetiginized   treated with otc CLN and Duobrii with a good response).  The following portions of the chart were reviewed this encounter and updated as appropriate:   Tobacco  Allergies  Meds  Problems  Med Hx  Surg Hx  Fam Hx     Review of Systems:  No other skin or systemic complaints except as noted in HPI or Assessment and Plan.  Objective  Well appearing patient in no apparent distress; mood and affect are within normal limits.  A focused examination was performed including face,scalp. Relevant physical exam findings are noted in the Assessment and Plan.  post scalp Stuck-on, waxy, tan-brown papule --Discussed benign etiology and prognosis.   post scalp Clear skin from impetigo -  no impetigo remaining.   Assessment & Plan  Inflamed seborrheic keratosis post scalp Symptomatic, irritating, patient would like treated.  Destruction of lesion - post scalp Complexity: simple   Destruction method: cryotherapy   Informed consent: discussed and consent obtained   Timeout:  patient name, date of birth, surgical site, and procedure verified Lesion destroyed using liquid nitrogen: Yes   Region frozen until ice ball extended beyond lesion: Yes   Outcome: patient tolerated procedure well with no complications   Post-procedure details: wound care instructions given    Impetigo post scalp Hx of Inflamed seborrheic keratosis with bacterial colonization / impetiginization / impetigo Impetigo resolved Samples of CLN shampoo given use prn for recurrence  Return in about 1 year (around 05/05/2023) for TBSE, hx of BCC.  IMarye Round, CMA, am acting as scribe for Sarina Ser, MD .  Documentation: I have reviewed the above documentation for accuracy and completeness, and I agree with the above.  Sarina Ser, MD

## 2022-05-04 NOTE — Progress Notes (Deleted)
   Follow-Up Visit   Subjective  Mark Valencia is a 76 y.o. male who presents for the following: Follow-up.    The following portions of the chart were reviewed this encounter and updated as appropriate:       Review of Systems:  No other skin or systemic complaints except as noted in HPI or Assessment and Plan.  Objective  Well appearing patient in no apparent distress; mood and affect are within normal limits.  A focused examination was performed including face,scalp. Relevant physical exam findings are noted in the Assessment and Plan.    Assessment & Plan   No follow-ups on file.  IMarye Round, CMA, am acting as scribe for Sarina Ser, MD .

## 2022-05-04 NOTE — Patient Instructions (Addendum)

## 2022-05-08 ENCOUNTER — Encounter: Payer: Self-pay | Admitting: Dermatology

## 2022-05-30 DIAGNOSIS — H26493 Other secondary cataract, bilateral: Secondary | ICD-10-CM | POA: Diagnosis not present

## 2022-05-30 DIAGNOSIS — H401130 Primary open-angle glaucoma, bilateral, stage unspecified: Secondary | ICD-10-CM | POA: Diagnosis not present

## 2022-05-30 DIAGNOSIS — H04123 Dry eye syndrome of bilateral lacrimal glands: Secondary | ICD-10-CM | POA: Diagnosis not present

## 2022-05-30 DIAGNOSIS — H18593 Other hereditary corneal dystrophies, bilateral: Secondary | ICD-10-CM | POA: Diagnosis not present

## 2022-05-30 DIAGNOSIS — H524 Presbyopia: Secondary | ICD-10-CM | POA: Diagnosis not present

## 2022-05-30 DIAGNOSIS — Z961 Presence of intraocular lens: Secondary | ICD-10-CM | POA: Diagnosis not present

## 2022-09-13 ENCOUNTER — Ambulatory Visit: Payer: Medicare Other | Admitting: Dermatology

## 2022-09-14 ENCOUNTER — Ambulatory Visit (INDEPENDENT_AMBULATORY_CARE_PROVIDER_SITE_OTHER): Payer: Medicare Other | Admitting: Dermatology

## 2022-09-14 DIAGNOSIS — L578 Other skin changes due to chronic exposure to nonionizing radiation: Secondary | ICD-10-CM | POA: Diagnosis not present

## 2022-09-14 DIAGNOSIS — Z85828 Personal history of other malignant neoplasm of skin: Secondary | ICD-10-CM

## 2022-09-14 DIAGNOSIS — L814 Other melanin hyperpigmentation: Secondary | ICD-10-CM | POA: Diagnosis not present

## 2022-09-14 DIAGNOSIS — D229 Melanocytic nevi, unspecified: Secondary | ICD-10-CM

## 2022-09-14 DIAGNOSIS — Z1283 Encounter for screening for malignant neoplasm of skin: Secondary | ICD-10-CM | POA: Diagnosis not present

## 2022-09-14 DIAGNOSIS — M67449 Ganglion, unspecified hand: Secondary | ICD-10-CM

## 2022-09-14 DIAGNOSIS — M67441 Ganglion, right hand: Secondary | ICD-10-CM | POA: Diagnosis not present

## 2022-09-14 DIAGNOSIS — L570 Actinic keratosis: Secondary | ICD-10-CM

## 2022-09-14 DIAGNOSIS — L821 Other seborrheic keratosis: Secondary | ICD-10-CM

## 2022-09-14 DIAGNOSIS — L820 Inflamed seborrheic keratosis: Secondary | ICD-10-CM | POA: Diagnosis not present

## 2022-09-14 NOTE — Patient Instructions (Signed)
Due to recent changes in healthcare laws, you may see results of your pathology and/or laboratory studies on MyChart before the doctors have had a chance to review them. We understand that in some cases there may be results that are confusing or concerning to you. Please understand that not all results are received at the same time and often the doctors may need to interpret multiple results in order to provide you with the best plan of care or course of treatment. Therefore, we ask that you please give us 2 business days to thoroughly review all your results before contacting the office for clarification. Should we see a critical lab result, you will be contacted sooner.   If You Need Anything After Your Visit  If you have any questions or concerns for your doctor, please call our main line at [phone] and press option 4 to reach your doctor's medical assistant. If no one answers, please leave a voicemail as directed and we will return your call as soon as possible. Messages left after 4 pm will be answered the following business day.   You may also send us a message via MyChart. We typically respond to MyChart messages within 1-2 business days.  For prescription refills, please ask your pharmacy to contact our office. Our fax number is [phone].  If you have an urgent issue when the clinic is closed that cannot wait until the next business day, you can page your doctor at the number below.    Please note that while we do our best to be available for urgent issues outside of office hours, we are not available 24/7.   If you have an urgent issue and are unable to reach us, you may choose to seek medical care at your doctor's office, retail clinic, urgent care center, or emergency room.  If you have a medical emergency, please immediately call 911 or go to the emergency department.  Pager Numbers  - Dr. Kowalski: [phone]  - Dr. Moye: [phone]  - Dr. Stewart:  [phone]  In the event of inclement weather, please call our main line at [phone] for an update on the status of any delays or closures.  Dermatology Medication Tips: Please keep the boxes that topical medications come in in order to help keep track of the instructions about where and how to use these. Pharmacies typically print the medication instructions only on the boxes and not directly on the medication tubes.   If your medication is too expensive, please contact our office at [phone] option 4 or send us a message through MyChart.   We are unable to tell what your co-pay for medications will be in advance as this is different depending on your insurance coverage. However, we may be able to find a substitute medication at lower cost or fill out paperwork to get insurance to cover a needed medication.   If a prior authorization is required to get your medication covered by your insurance company, please allow us 1-2 business days to complete this process.  Drug prices often vary depending on where the prescription is filled and some pharmacies may offer cheaper prices.  The website www.goodrx.com contains coupons for medications through different pharmacies. The prices here do not account for what the cost may be with help from insurance (it may be cheaper with your insurance), but the website can give you the price if you did not use any insurance.  - You can print the associated coupon and take it with   your prescription to the pharmacy.  - You may also stop by our office during regular business hours and pick up a GoodRx coupon card.  - If you need your prescription sent electronically to a different pharmacy, notify our office through [place] MyChart or by phone at [phone] option 4.     Si Usted Necesita Algo Despus de Su Visita  Tambin puede enviarnos un mensaje a travs de MyChart. Por lo general respondemos a los mensajes de MyChart en el transcurso de 1 a 2  das hbiles.  Para renovar recetas, por favor pida a su farmacia que se ponga en contacto con nuestra oficina. Nuestro nmero de fax es el [phone].  Si tiene un asunto urgente cuando la clnica est cerrada y que no puede esperar hasta el siguiente da hbil, puede llamar/localizar a su doctor(a) al nmero que aparece a continuacin.   Por favor, tenga en cuenta que aunque hacemos todo lo posible para estar disponibles para asuntos urgentes fuera del horario de oficina, no estamos disponibles las 24 horas del da, los 7 das de la semana.   Si tiene un problema urgente y no puede comunicarse con nosotros, puede optar por buscar atencin mdica  en el consultorio de su doctor(a), en una clnica privada, en un centro de atencin urgente o en una sala de emergencias.  Si tiene una emergencia mdica, por favor llame inmediatamente al 911 o vaya a la sala de emergencias.  Nmeros de bper  - Dr. Kowalski: [phone]  - Dra. Moye: [phone]  - Dra. Stewart: [phone]  En caso de inclemencias del tiempo, por favor llame a nuestra lnea principal al [phone] para una actualizacin sobre el estado de cualquier retraso o cierre.  Consejos para la medicacin en dermatologa: Por favor, guarde las cajas en las que vienen los medicamentos de uso tpico para ayudarle a seguir las instrucciones sobre dnde y cmo usarlos. Las farmacias generalmente imprimen las instrucciones del medicamento slo en las cajas y no directamente en los tubos del medicamento.   Si su medicamento es muy caro, por favor, pngase en contacto con nuestra oficina llamando al [phone] y presione la opcin 4 o envenos un mensaje a travs de MyChart.   No podemos decirle cul ser su copago por los medicamentos por adelantado ya que esto es diferente dependiendo de la cobertura de su seguro. Sin embargo, es posible que podamos encontrar un medicamento sustituto a menor costo o llenar un formulario para que el  seguro cubra el medicamento que se considera necesario.   Si se requiere una autorizacin previa para que su compaa de seguros cubra su medicamento, por favor permtanos de 1 a 2 das hbiles para completar este proceso.  Los precios de los medicamentos varan con frecuencia dependiendo del lugar de dnde se surte la receta y alguna farmacias pueden ofrecer precios ms baratos.  El sitio web www.goodrx.com tiene cupones para medicamentos de diferentes farmacias. Los precios aqu no tienen en cuenta lo que podra costar con la ayuda del seguro (puede ser ms barato con su seguro), pero el sitio web puede darle el precio si no utiliz ningn seguro.  - Puede imprimir el cupn correspondiente y llevarlo con su receta a la farmacia.  - Tambin puede pasar por nuestra oficina durante el horario de atencin regular y recoger una tarjeta de cupones de GoodRx.  - Si necesita que su receta se enve electrnicamente a una farmacia diferente, informe a nuestra oficina a travs de MyChart de [place]   o por telfono llamando al [phone] y presione la opcin 4.  

## 2022-09-14 NOTE — Progress Notes (Signed)
Follow-Up Visit   Subjective  Mark Valencia is a 76 y.o. male who presents for the following: Total body skin exam (Hx of BCCs, hx of AKs). The patient presents for Total-Body Skin Exam (TBSE) for skin cancer screening and mole check.  The patient has spots, moles and lesions to be evaluated, some may be new or changing and the patient has concerns that these could be cancer.   The following portions of the chart were reviewed this encounter and updated as appropriate:   Tobacco  Allergies  Meds  Problems  Med Hx  Surg Hx  Fam Hx     Review of Systems:  No other skin or systemic complaints except as noted in HPI or Assessment and Plan.  Objective  Well appearing patient in no apparent distress; mood and affect are within normal limits.  A full examination was performed including scalp, head, eyes, ears, nose, lips, neck, chest, axillae, abdomen, back, buttocks, bilateral upper extremities, bilateral lower extremities, hands, feet, fingers, toes, fingernails, and toenails. All findings within normal limits unless otherwise noted below.  face x 10, L arm x 3 (13) Stuck on waxy paps with erythema  L cheek x 1 Pink scaly macules  R great toe Cystic pap   Assessment & Plan   Lentigines - Scattered tan macules - Due to sun exposure - Benign-appearing, observe - Recommend daily broad spectrum sunscreen SPF 30+ to sun-exposed areas, reapply every 2 hours as needed. - Call for any changes  Seborrheic Keratoses - Stuck-on, waxy, tan-brown papules and/or plaques  - Benign-appearing - Discussed benign etiology and prognosis. - Observe - Call for any changes  Melanocytic Nevi - Tan-brown and/or pink-flesh-colored symmetric macules and papules - Benign appearing on exam today - Observation - Call clinic for new or changing moles - Recommend daily use of broad spectrum spf 30+ sunscreen to sun-exposed areas.   Hemangiomas - Red papules - Discussed benign nature -  Observe - Call for any changes  Actinic Damage - Chronic condition, secondary to cumulative UV/sun exposure - diffuse scaly erythematous macules with underlying dyspigmentation - Recommend daily broad spectrum sunscreen SPF 30+ to sun-exposed areas, reapply every 2 hours as needed.  - Staying in the shade or wearing long sleeves, sun glasses (UVA+UVB protection) and wide brim hats (4-inch brim around the entire circumference of the hat) are also recommended for sun protection.  - Call for new or changing lesions.  Skin cancer screening performed today.   History of Basal Cell Carcinoma of the Skin - No evidence of recurrence today - Recommend regular full body skin exams - Recommend daily broad spectrum sunscreen SPF 30+ to sun-exposed areas, reapply every 2 hours as needed.  - Call if any new or changing lesions are noted between office visits  - Lower R shin, posterior neck  Inflamed seborrheic keratosis (13) face x 10, L arm x 3 Symptomatic, irritating, patient would like treated. Destruction of lesion - face x 10, L arm x 3 Complexity: simple   Destruction method: cryotherapy   Informed consent: discussed and consent obtained   Timeout:  patient name, date of birth, surgical site, and procedure verified Lesion destroyed using liquid nitrogen: Yes   Region frozen until ice ball extended beyond lesion: Yes   Outcome: patient tolerated procedure well with no complications   Post-procedure details: wound care instructions given    AK (actinic keratosis) L cheek x 1 Destruction of lesion - L cheek x 1 Complexity: simple  Destruction method: cryotherapy   Informed consent: discussed and consent obtained   Timeout:  patient name, date of birth, surgical site, and procedure verified Lesion destroyed using liquid nitrogen: Yes   Region frozen until ice ball extended beyond lesion: Yes   Outcome: patient tolerated procedure well with no complications   Post-procedure details:  wound care instructions given    Digital mucous cyst R great toe A digital mucous cyst also known as a myxoid cyst or pseudocyst is a ganglion cyst arising from the distal interphalangeal (DIP) joint of the finger or thumb (or, less commonly, toe). The cysts are believed to form from degeneration of connective tissue and are associated with osteoarthritic joints or injury. Although the exact etiology is unknown, it is likely that a small tear forms in a joint capsule or tendon sheath, allowing extravasation of synovial fluid into the adjacent tissue. When the fluid reacts with local tissue, it becomes more gelatinous and a cyst wall forms. With any treatment, there is a high rate of recurrence.   Treatment options include: - Puncture / Incision & Drainage (I&D) - Intralesional steroid injection - Intralesional Sclerosant injection (Asclera/ Polidocanol) - Intralesional steroid + sclerosant - Corticosteroid tape - Cryosurgery - Laser (CO2) - Infrared photocoagulation - Excision / Surgery  Pt declines treatment today.  Observe for change.  Varicose Veins/Spider Veins - Dilated blue, purple or red veins at the lower extremities - Reassured - Smaller vessels can be treated by sclerotherapy (a procedure to inject a medicine into the veins to make them disappear) if desired, but the treatment is not covered by insurance. Larger vessels may be covered if symptomatic and we would refer to vascular surgeon if treatment desired.  - bil legs  Return in about 1 year (around 09/15/2023) for TBSE, Hx of BCC, Hx of AKs.  I, Othelia Pulling, RMA, am acting as scribe for Sarina Ser, MD . Documentation: I have reviewed the above documentation for accuracy and completeness, and I agree with the above.  Sarina Ser, MD

## 2022-09-18 DIAGNOSIS — E785 Hyperlipidemia, unspecified: Secondary | ICD-10-CM | POA: Diagnosis not present

## 2022-09-18 DIAGNOSIS — Z6821 Body mass index (BMI) 21.0-21.9, adult: Secondary | ICD-10-CM | POA: Diagnosis not present

## 2022-09-18 DIAGNOSIS — R519 Headache, unspecified: Secondary | ICD-10-CM | POA: Diagnosis not present

## 2022-09-18 DIAGNOSIS — K219 Gastro-esophageal reflux disease without esophagitis: Secondary | ICD-10-CM | POA: Diagnosis not present

## 2022-09-18 DIAGNOSIS — Z23 Encounter for immunization: Secondary | ICD-10-CM | POA: Diagnosis not present

## 2022-09-18 DIAGNOSIS — G5793 Unspecified mononeuropathy of bilateral lower limbs: Secondary | ICD-10-CM | POA: Diagnosis not present

## 2022-09-18 DIAGNOSIS — Z125 Encounter for screening for malignant neoplasm of prostate: Secondary | ICD-10-CM | POA: Diagnosis not present

## 2022-09-18 DIAGNOSIS — G8929 Other chronic pain: Secondary | ICD-10-CM | POA: Diagnosis not present

## 2022-09-18 DIAGNOSIS — K2270 Barrett's esophagus without dysplasia: Secondary | ICD-10-CM | POA: Diagnosis not present

## 2022-09-18 DIAGNOSIS — F419 Anxiety disorder, unspecified: Secondary | ICD-10-CM | POA: Diagnosis not present

## 2022-09-25 ENCOUNTER — Encounter: Payer: Self-pay | Admitting: Dermatology

## 2022-10-05 DIAGNOSIS — R531 Weakness: Secondary | ICD-10-CM | POA: Diagnosis not present

## 2022-10-05 DIAGNOSIS — R051 Acute cough: Secondary | ICD-10-CM | POA: Diagnosis not present

## 2022-10-05 DIAGNOSIS — Z03818 Encounter for observation for suspected exposure to other biological agents ruled out: Secondary | ICD-10-CM | POA: Diagnosis not present

## 2022-10-05 DIAGNOSIS — R0602 Shortness of breath: Secondary | ICD-10-CM | POA: Diagnosis not present

## 2022-12-27 DIAGNOSIS — H26493 Other secondary cataract, bilateral: Secondary | ICD-10-CM | POA: Diagnosis not present

## 2023-01-11 DIAGNOSIS — H26491 Other secondary cataract, right eye: Secondary | ICD-10-CM | POA: Diagnosis not present

## 2023-01-12 DIAGNOSIS — H40013 Open angle with borderline findings, low risk, bilateral: Secondary | ICD-10-CM | POA: Diagnosis not present

## 2023-01-12 DIAGNOSIS — H26491 Other secondary cataract, right eye: Secondary | ICD-10-CM | POA: Diagnosis not present

## 2023-01-29 DIAGNOSIS — H26492 Other secondary cataract, left eye: Secondary | ICD-10-CM | POA: Diagnosis not present

## 2023-02-26 ENCOUNTER — Other Ambulatory Visit: Payer: Self-pay | Admitting: *Deleted

## 2023-02-26 DIAGNOSIS — I8392 Asymptomatic varicose veins of left lower extremity: Secondary | ICD-10-CM

## 2023-03-09 ENCOUNTER — Ambulatory Visit (HOSPITAL_COMMUNITY)
Admission: RE | Admit: 2023-03-09 | Discharge: 2023-03-09 | Disposition: A | Discharge status: Home / Self Care | Payer: Medicare Other | Source: Ambulatory Visit | Attending: Vascular Surgery | Admitting: Vascular Surgery

## 2023-03-09 DIAGNOSIS — I8392 Asymptomatic varicose veins of left lower extremity: Secondary | ICD-10-CM | POA: Insufficient documentation

## 2023-03-14 ENCOUNTER — Encounter: Payer: Medicare Other | Admitting: Vascular Surgery

## 2023-04-04 ENCOUNTER — Encounter: Payer: Self-pay | Admitting: Vascular Surgery

## 2023-04-04 ENCOUNTER — Ambulatory Visit (INDEPENDENT_AMBULATORY_CARE_PROVIDER_SITE_OTHER): Payer: Medicare Other | Admitting: Vascular Surgery

## 2023-04-04 VITALS — BP 128/83 | HR 72 | Temp 98.8°F | Resp 18 | Ht 73.0 in | Wt 167.0 lb

## 2023-04-04 DIAGNOSIS — I83812 Varicose veins of left lower extremities with pain: Secondary | ICD-10-CM

## 2023-04-04 NOTE — Progress Notes (Signed)
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  He has not had formal venous reflux testing on the right but does appear to have reflux in the right great saphenous vein.  On the left side he has only a short segment of reflux in the superficial system with no deep venous reflux.  He has a lot of burning pain in his feet which I think can be attributed to his neuropathy.  We have discussed conservative measures for the treatment of his venous disease.  I have encouraged him to avoid prolonged sitting and standing.  We discussed importance of exercise specifically walking and water aerobics.  We discussed use of compression stockings.  I encouraged him to elevate his legs daily and we discussed the proper positioning for this.  Although on exam he does have evidence of some arterial insufficiency he does not describe any claudication or rest pain.  If his symptoms progress and I think he will need formal venous reflux testing on the right side.  Currently however he is not a candidate for intervention on the left.  He will call if his symptoms progress.  REASON FOR CONSULT:    Varicose veins of the left lower extremity.  The patient is self-referred.  HPI:   Mark Valencia is a 77 y.o. male who presents with painful varicose veins of both lower extremities.  On my history the patient has had varicose veins for many years.  His chief complaint however is burning pain in his feet.  He does describe some aching pain with standing and sitting.  He did have sclerotherapy about 20 years ago.  He has a history of neuropathy.  He does have a family history of varicose veins.  He had no previous history of DVT.  He denies any history of claudication, rest pain, or nonhealing ulcers.  Past Medical History:  Diagnosis Date   Actinic keratosis    Allergy    Basal cell carcinoma    lower right shin, right posterior neck (Per patient)    Cataract 2021   Right eye   Chronic headaches    Eczema    Sinus pain     History reviewed. No pertinent family history.  SOCIAL HISTORY: Social History   Tobacco Use   Smoking status: Former    Packs/day: 1.00    Years: 3.00    Additional pack years: 0.00    Total pack years: 3.00    Types: Cigarettes    Quit date: 12/05/1967    Years since quitting: 55.3   Smokeless tobacco: Never  Substance Use Topics   Alcohol use: No    No Known Allergies  Current Outpatient Medications  Medication Sig Dispense Refill   aspirin 81 MG tablet Take 81 mg by mouth daily.     Clorazepate Dipotassium (TRANXENE-T PO) Take 1 tablet by mouth daily.     gabapentin (NEURONTIN) 600 MG tablet 1 to 3 tablets daily     metroNIDAZOLE (METROGEL) 1 % gel Apply topically daily.     omeprazole (PRILOSEC) 40 MG capsule Take 40 mg by mouth 2 (two) times daily.     acetaminophen-codeine (TYLENOL #3) 300-30 MG per tablet One to two every 4 hours as needed for cough (Patient not taking: Reported on 09/19/2021) 40 tablet 0   amoxicillin-clavulanate (AUGMENTIN) 875-125 MG per tablet Take 1 tablet by mouth 2 (two) times daily. (Patient not taking: Reported on 09/19/2021) 20  tablet 0   dextromethorphan (DELSYM) 30 MG/5ML liquid 2 tsp every 12 hrs as needed (Patient not taking: Reported on 04/04/2023)     mupirocin ointment (BACTROBAN) 2 % Place 1 application into the nose daily. Qd to surgery site (Patient not taking: Reported on 09/19/2021) 22 g 0   No current facility-administered medications for this visit.    REVIEW OF SYSTEMS:  [X]  denotes positive finding, [ ]  denotes negative finding Cardiac  Comments:  Chest pain or chest pressure:    Shortness of breath upon exertion:    Short of breath when lying flat:    Irregular heart rhythm:        Vascular    Pain in calf, thigh, or hip brought on by ambulation:    Pain in feet at night that wakes you up from your sleep:     Blood clot in your veins:     Leg swelling:         Pulmonary    Oxygen at home:    Productive cough:     Wheezing:         Neurologic    Sudden weakness in arms or legs:     Sudden numbness in arms or legs:     Sudden onset of difficulty speaking or slurred speech:    Temporary loss of vision in one eye:     Problems with dizziness:         Gastrointestinal    Blood in stool:     Vomited blood:         Genitourinary    Burning when urinating:     Blood in urine:        Psychiatric    Major depression:         Hematologic    Bleeding problems:    Problems with blood clotting too easily:        Skin    Rashes or ulcers:        Constitutional    Fever or chills:    -  PHYSICAL EXAM:   Vitals:   04/04/23 1516  BP: 128/83  Pulse: 72  Resp: 18  Temp: 98.8 F (37.1 C)  TempSrc: Temporal  SpO2: 97%  Weight: 167 lb (75.8 kg)  Height: 6\' 1"  (1.854 m)   Body mass index is 22.03 kg/m. GENERAL: The patient is a well-nourished male, in no acute distress. The vital signs are documented above. CARDIAC: There is a regular rate and rhythm.  VASCULAR: I do not detect carotid bruits. He has a monophasic dorsalis pedis and posterior tibial signal bilaterally. He has varicose veins of both lower extremities and also hyperpigmentation bilaterally as documented in the photographs below.       I did look at his right great saphenous vein myself with the SonoSite and he does have a dilated saphenous vein with large tributaries off of this.  I suspect he has significant reflux in the right great saphenous vein. PULMONARY: There is good air exchange bilaterally without wheezing or rales. ABDOMEN: Soft and non-tender with normal pitched bowel sounds.  MUSCULOSKELETAL: There are no major deformities. NEUROLOGIC: No focal weakness or paresthesias are detected. SKIN: There are no ulcers or rashes noted. PSYCHIATRIC: The patient has a normal affect.  DATA:    VENOUS DUPLEX: I have reviewed the venous  duplex scan that was done on 03/09/2023.  This was of the left lower extremity only.  There was no evidence of DVT.  There was  no deep venous reflux.  There was some reflux in the proximal left great saphenous vein and the vein was dilated up to 4.5 mm.  However there was no reflux at the saphenofemoral junction.  The results of the study are summarized in the diagram below.    Waverly [facility]

## 2023-05-16 ENCOUNTER — Ambulatory Visit: Payer: Medicare Other | Admitting: Dermatology

## 2023-08-28 DIAGNOSIS — Z0000 Encounter for general adult medical examination without abnormal findings: Secondary | ICD-10-CM | POA: Diagnosis not present

## 2023-08-28 DIAGNOSIS — Z9181 History of falling: Secondary | ICD-10-CM | POA: Diagnosis not present

## 2023-09-04 DIAGNOSIS — M542 Cervicalgia: Secondary | ICD-10-CM | POA: Diagnosis not present

## 2023-09-04 DIAGNOSIS — S46811A Strain of other muscles, fascia and tendons at shoulder and upper arm level, right arm, initial encounter: Secondary | ICD-10-CM | POA: Diagnosis not present

## 2023-09-20 DIAGNOSIS — Z0000 Encounter for general adult medical examination without abnormal findings: Secondary | ICD-10-CM | POA: Diagnosis not present

## 2023-09-20 DIAGNOSIS — E785 Hyperlipidemia, unspecified: Secondary | ICD-10-CM | POA: Diagnosis not present

## 2023-09-20 DIAGNOSIS — Z23 Encounter for immunization: Secondary | ICD-10-CM | POA: Diagnosis not present

## 2023-09-20 DIAGNOSIS — Z6821 Body mass index (BMI) 21.0-21.9, adult: Secondary | ICD-10-CM | POA: Diagnosis not present

## 2023-09-20 DIAGNOSIS — Z125 Encounter for screening for malignant neoplasm of prostate: Secondary | ICD-10-CM | POA: Diagnosis not present

## 2023-09-24 DIAGNOSIS — R1013 Epigastric pain: Secondary | ICD-10-CM | POA: Diagnosis not present

## 2023-09-25 DIAGNOSIS — I77811 Abdominal aortic ectasia: Secondary | ICD-10-CM | POA: Diagnosis not present

## 2023-09-25 DIAGNOSIS — R1013 Epigastric pain: Secondary | ICD-10-CM | POA: Diagnosis not present

## 2023-09-26 ENCOUNTER — Ambulatory Visit: Payer: Medicare Other | Admitting: Dermatology

## 2023-09-26 VITALS — BP 126/71

## 2023-09-26 DIAGNOSIS — I781 Nevus, non-neoplastic: Secondary | ICD-10-CM

## 2023-09-26 DIAGNOSIS — I8393 Asymptomatic varicose veins of bilateral lower extremities: Secondary | ICD-10-CM

## 2023-09-26 DIAGNOSIS — I872 Venous insufficiency (chronic) (peripheral): Secondary | ICD-10-CM

## 2023-09-26 DIAGNOSIS — Z872 Personal history of diseases of the skin and subcutaneous tissue: Secondary | ICD-10-CM

## 2023-09-26 DIAGNOSIS — L820 Inflamed seborrheic keratosis: Secondary | ICD-10-CM | POA: Diagnosis not present

## 2023-09-26 DIAGNOSIS — L729 Follicular cyst of the skin and subcutaneous tissue, unspecified: Secondary | ICD-10-CM

## 2023-09-26 DIAGNOSIS — L821 Other seborrheic keratosis: Secondary | ICD-10-CM

## 2023-09-26 DIAGNOSIS — W908XXA Exposure to other nonionizing radiation, initial encounter: Secondary | ICD-10-CM

## 2023-09-26 DIAGNOSIS — L814 Other melanin hyperpigmentation: Secondary | ICD-10-CM

## 2023-09-26 DIAGNOSIS — Z85828 Personal history of other malignant neoplasm of skin: Secondary | ICD-10-CM

## 2023-09-26 DIAGNOSIS — L578 Other skin changes due to chronic exposure to nonionizing radiation: Secondary | ICD-10-CM

## 2023-09-26 DIAGNOSIS — L700 Acne vulgaris: Secondary | ICD-10-CM

## 2023-09-26 DIAGNOSIS — L817 Pigmented purpuric dermatosis: Secondary | ICD-10-CM

## 2023-09-26 DIAGNOSIS — Z1283 Encounter for screening for malignant neoplasm of skin: Secondary | ICD-10-CM | POA: Diagnosis not present

## 2023-09-26 DIAGNOSIS — D1801 Hemangioma of skin and subcutaneous tissue: Secondary | ICD-10-CM

## 2023-09-26 DIAGNOSIS — D229 Melanocytic nevi, unspecified: Secondary | ICD-10-CM

## 2023-09-26 NOTE — Patient Instructions (Addendum)

## 2023-09-26 NOTE — Progress Notes (Signed)
Follow-Up Visit   Subjective  Mark Valencia is a 77 y.o. male who presents for the following: Skin Cancer Screening and Full Body Skin Exam hx of BCC, Aks, check spot arm scaly  The patient presents for Total-Body Skin Exam (TBSE) for skin cancer screening and mole check. The patient has spots, moles and lesions to be evaluated, some may be new or changing and the patient may have concern these could be cancer.    The following portions of the chart were reviewed this encounter and updated as appropriate: medications, allergies, medical history  Review of Systems:  No other skin or systemic complaints except as noted in HPI or Assessment and Plan.  Objective  Well appearing patient in no apparent distress; mood and affect are within normal limits.  A full examination was performed including scalp, head, eyes, ears, nose, lips, neck, chest, axillae, abdomen, back, buttocks, bilateral upper extremities, bilateral lower extremities, hands, feet, fingers, toes, fingernails, and toenails. All findings within normal limits unless otherwise noted below.   Relevant physical exam findings are noted in the Assessment and Plan.  L abdomen x 1 Stuck on waxy paps with erythema    Assessment & Plan   SKIN CANCER SCREENING PERFORMED TODAY.  ACTINIC DAMAGE - Chronic condition, secondary to cumulative UV/sun exposure - diffuse scaly erythematous macules with underlying dyspigmentation - Recommend daily broad spectrum sunscreen SPF 30+ to sun-exposed areas, reapply every 2 hours as needed.  - Staying in the shade or wearing long sleeves, sun glasses (UVA+UVB protection) and wide brim hats (4-inch brim around the entire circumference of the hat) are also recommended for sun protection.  - Call for new or changing lesions.  LENTIGINES, SEBORRHEIC KERATOSES, HEMANGIOMAS - Benign normal skin lesions - Benign-appearing - Call for any changes  MELANOCYTIC NEVI - Tan-brown and/or  pink-flesh-colored symmetric macules and papules - Benign appearing on exam today - Observation - Call clinic for new or changing moles - Recommend daily use of broad spectrum spf 30+ sunscreen to sun-exposed areas.   History of Basal Cell Carcinoma of the Skin - No evidence of recurrence today - Recommend regular full body skin exams - Recommend daily broad spectrum sunscreen SPF 30+ to sun-exposed areas, reapply every 2 hours as needed.  - Call if any new or changing lesions are noted between office visits  - Lower R shin, posterior neck  COMEDONE/CYST Exam: R deltoid comedone  Treatment Plan: Benign-appearing.  Observation.  Call clinic for new or changing moles.  Recommend daily use of broad spectrum spf 30+ sunscreen to sun-exposed areas.    Varicose Veins/Spider Veins - Dilated blue, purple or red veins at the lower extremities - Reassured - Smaller vessels can be treated by sclerotherapy (a procedure to inject a medicine into the veins to make them disappear) if desired, but the treatment is not covered by insurance. Larger vessels may be covered if symptomatic and we would refer to vascular surgeon if treatment desired.   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  Chronic and persistent condition with duration or expected duration over one year. Condition is symptomatic / bothersome to patient. Not to goal.   Stasis in the legs causes chronic leg swelling, which may result in itchy or painful rashes, skin discoloration, skin texture changes, and sometimes ulceration.  Recommend daily graduated compression hose/stockings- easiest to put on first thing in morning, remove at bedtime.  Elevate legs as much as possible. Avoid salt/sodium rich foods.  Treatment Plan: Recommend compression socks   HISTORY OF PRECANCEROUS ACTINIC KERATOSIS - site(s) of  PreCancerous Actinic Keratosis clear today. - these may recur and new lesions may form requiring treatment to prevent transformation into skin cancer - observe for new or changing spots and contact [facility] for appointment if occur - photoprotection with sun protective clothing; sunglasses and broad spectrum sunscreen with SPF of at least 30 + and frequent self skin exams recommended - yearly exams by a dermatologist recommended for persons with history of PreCancerous Actinic Keratoses   Inflamed seborrheic keratosis L abdomen x 1  Symptomatic, irritating, patient would like treated.  Destruction of lesion - L abdomen x 1 Complexity: simple   Destruction method: cryotherapy   Informed consent: discussed and consent obtained   Timeout:  patient name, date of birth, surgical site, and procedure verified Lesion destroyed using liquid nitrogen: Yes   Region frozen until ice ball extended beyond lesion: Yes   Outcome: patient tolerated procedure well with no complications   Post-procedure details: wound care instructions given     Return in about 1 year (around 09/25/2024) for TBSE, Hx of BCC, Hx of AKs.  I, Ardis Rowan, RMA, am acting as scribe for Armida Sans, MD .   Documentation: I have reviewed the above documentation for accuracy and completeness, and I agree with the above.  Armida Sans, MD

## 2023-09-28 DIAGNOSIS — K4090 Unilateral inguinal hernia, without obstruction or gangrene, not specified as recurrent: Secondary | ICD-10-CM | POA: Diagnosis not present

## 2023-10-07 ENCOUNTER — Encounter: Payer: Self-pay | Admitting: Dermatology

## 2023-10-29 DIAGNOSIS — Z79899 Other long term (current) drug therapy: Secondary | ICD-10-CM | POA: Diagnosis not present

## 2023-10-29 DIAGNOSIS — K2270 Barrett's esophagus without dysplasia: Secondary | ICD-10-CM | POA: Diagnosis not present

## 2023-10-29 DIAGNOSIS — K219 Gastro-esophageal reflux disease without esophagitis: Secondary | ICD-10-CM | POA: Diagnosis not present

## 2023-10-29 DIAGNOSIS — K4090 Unilateral inguinal hernia, without obstruction or gangrene, not specified as recurrent: Secondary | ICD-10-CM | POA: Diagnosis not present

## 2023-10-29 DIAGNOSIS — Z7982 Long term (current) use of aspirin: Secondary | ICD-10-CM | POA: Diagnosis not present

## 2023-10-29 DIAGNOSIS — F5104 Psychophysiologic insomnia: Secondary | ICD-10-CM | POA: Diagnosis not present

## 2023-10-29 DIAGNOSIS — Z87891 Personal history of nicotine dependence: Secondary | ICD-10-CM | POA: Diagnosis not present

## 2023-10-29 DIAGNOSIS — E785 Hyperlipidemia, unspecified: Secondary | ICD-10-CM | POA: Diagnosis not present

## 2023-10-29 DIAGNOSIS — F419 Anxiety disorder, unspecified: Secondary | ICD-10-CM | POA: Diagnosis not present

## 2024-09-25 ENCOUNTER — Ambulatory Visit: Payer: Federal, State, Local not specified - PPO | Admitting: Dermatology

## 2024-09-25 ENCOUNTER — Encounter: Payer: Self-pay | Admitting: Dermatology

## 2024-09-25 DIAGNOSIS — Z1283 Encounter for screening for malignant neoplasm of skin: Secondary | ICD-10-CM | POA: Diagnosis not present

## 2024-09-25 DIAGNOSIS — L570 Actinic keratosis: Secondary | ICD-10-CM | POA: Diagnosis not present

## 2024-09-25 DIAGNOSIS — L820 Inflamed seborrheic keratosis: Secondary | ICD-10-CM | POA: Diagnosis not present

## 2024-09-25 DIAGNOSIS — Z85828 Personal history of other malignant neoplasm of skin: Secondary | ICD-10-CM

## 2024-09-25 DIAGNOSIS — L729 Follicular cyst of the skin and subcutaneous tissue, unspecified: Secondary | ICD-10-CM

## 2024-09-25 DIAGNOSIS — L814 Other melanin hyperpigmentation: Secondary | ICD-10-CM

## 2024-09-25 DIAGNOSIS — D229 Melanocytic nevi, unspecified: Secondary | ICD-10-CM | POA: Diagnosis not present

## 2024-09-25 DIAGNOSIS — L578 Other skin changes due to chronic exposure to nonionizing radiation: Secondary | ICD-10-CM

## 2024-09-25 DIAGNOSIS — L720 Epidermal cyst: Secondary | ICD-10-CM

## 2024-09-25 NOTE — Progress Notes (Signed)
 Follow-Up Visit   Subjective  Mark Valencia is a 78 y.o. male who presents for the following: Skin Cancer Screening and Full Body Skin Exam, hx of BCC  The patient presents for Total-Body Skin Exam (TBSE) for skin cancer screening and mole check. The patient has spots, moles and lesions to be evaluated, some may be new or changing and the patient may have concern these could be cancer.  The following portions of the chart were reviewed this encounter and updated as appropriate: medications, allergies, medical history  Review of Systems:  No other skin or systemic complaints except as noted in HPI or Assessment and Plan.  Objective  Well appearing patient in no apparent distress; mood and affect are within normal limits.  A full examination was performed including scalp, head, eyes, ears, nose, lips, neck, chest, axillae, abdomen, back, buttocks, bilateral upper extremities, bilateral lower extremities, hands, feet, fingers, toes, fingernails, and toenails. All findings within normal limits unless otherwise noted below.   Relevant physical exam findings are noted in the Assessment and Plan.  Left Forearm x 1, right scalp x 4, left anterior axillary x 1, left side burn x 1 (7) Stuck-on, waxy, tan-brown papule or plaque --Discussed benign etiology and prognosis.  right cheek x 1, right side burn x 1 (2) Erythematous thin papules/macules with gritty scale.   Assessment & Plan   SKIN CANCER SCREENING PERFORMED TODAY.  ACTINIC DAMAGE - Chronic condition, secondary to cumulative UV/sun exposure - diffuse scaly erythematous macules with underlying dyspigmentation - Recommend daily broad spectrum sunscreen SPF 30+ to sun-exposed areas, reapply every 2 hours as needed.  - Staying in the shade or wearing long sleeves, sun glasses (UVA+UVB protection) and wide brim hats (4-inch brim around the entire circumference of the hat) are also recommended for sun protection.  - Call for new or changing  lesions.  LENTIGINES, SEBORRHEIC KERATOSES, HEMANGIOMAS - Benign normal skin lesions - Benign-appearing - Call for any changes  MELANOCYTIC NEVI - Tan-brown and/or pink-flesh-colored symmetric macules and papules - Benign appearing on exam today - Observation - Call clinic for new or changing moles - Recommend daily use of broad spectrum spf 30+ sunscreen to sun-exposed areas.   EPIDERMAL INCLUSION CYST Exam: 1 cm  Subcutaneous nodule at right tricep   Benign-appearing. Exam most consistent with an epidermal inclusion cyst. Discussed that a cyst is a benign growth that can grow over time and sometimes get irritated or inflamed. Recommend observation if it is not bothersome. Discussed option of surgical excision to remove it if it is growing, symptomatic, or other changes noted. Please call for new or changing lesions so they can be evaluated.  HISTORY OF BASAL CELL CARCINOMA OF THE SKIN - No evidence of recurrence today - Recommend regular full body skin exams - Recommend daily broad spectrum sunscreen SPF 30+ to sun-exposed areas, reapply every 2 hours as needed.  - Call if any new or changing lesions are noted between office visits    INFLAMED SEBORRHEIC KERATOSIS (7) Left Forearm x 1, right scalp x 4, left anterior axillary x 1, left side burn x 1 (7) Symptomatic, irritating, patient would like treated.  Destruction of lesion - Left Forearm x 1, right scalp x 4, left anterior axillary x 1, left side burn x 1 (7) Complexity: simple   Destruction method: cryotherapy   Informed consent: discussed and consent obtained   Timeout:  patient name, date of birth, surgical site, and procedure verified Lesion destroyed using liquid nitrogen:  Yes   Region frozen until ice ball extended beyond lesion: Yes   Outcome: patient tolerated procedure well with no complications   Post-procedure details: wound care instructions given    AK (ACTINIC KERATOSIS) (2) right cheek x 1, right side burn  x 1 (2) Destruction of lesion - right cheek x 1, right side burn x 1 (2) Complexity: simple   Destruction method: cryotherapy   Informed consent: discussed and consent obtained   Timeout:  patient name, date of birth, surgical site, and procedure verified Lesion destroyed using liquid nitrogen: Yes   Region frozen until ice ball extended beyond lesion: Yes   Outcome: patient tolerated procedure well with no complications   Post-procedure details: wound care instructions given      Return in about 1 year (around 09/25/2025) for TBSE, hx of BCC, hx of AKs .  IFay Kirks, CMA, am acting as scribe for Alm Rhyme, MD .   Documentation: I have reviewed the above documentation for accuracy and completeness, and I agree with the above.  Alm Rhyme, MD

## 2024-09-25 NOTE — Patient Instructions (Addendum)

## 2024-12-16 ENCOUNTER — Ambulatory Visit

## 2024-12-16 DIAGNOSIS — L578 Other skin changes due to chronic exposure to nonionizing radiation: Secondary | ICD-10-CM | POA: Diagnosis not present

## 2024-12-16 DIAGNOSIS — L905 Scar conditions and fibrosis of skin: Secondary | ICD-10-CM

## 2024-12-16 DIAGNOSIS — L814 Other melanin hyperpigmentation: Secondary | ICD-10-CM

## 2024-12-16 DIAGNOSIS — Z85828 Personal history of other malignant neoplasm of skin: Secondary | ICD-10-CM | POA: Diagnosis not present

## 2024-12-16 DIAGNOSIS — L570 Actinic keratosis: Secondary | ICD-10-CM

## 2024-12-16 DIAGNOSIS — L821 Other seborrheic keratosis: Secondary | ICD-10-CM

## 2024-12-16 DIAGNOSIS — L820 Inflamed seborrheic keratosis: Secondary | ICD-10-CM

## 2024-12-16 DIAGNOSIS — C4490 Unspecified malignant neoplasm of skin, unspecified: Secondary | ICD-10-CM

## 2024-12-16 DIAGNOSIS — W908XXA Exposure to other nonionizing radiation, initial encounter: Secondary | ICD-10-CM | POA: Diagnosis not present

## 2024-12-16 DIAGNOSIS — D489 Neoplasm of uncertain behavior, unspecified: Secondary | ICD-10-CM | POA: Diagnosis not present

## 2024-12-16 NOTE — Patient Instructions (Signed)

## 2024-12-16 NOTE — Progress Notes (Signed)
 "   Subjective   Mark Valencia is a 79 y.o. male who presents for the following: Lesion(s) of concern . Patient is established patient   Today patient reports: LOC on the scalp was treqated with cryotherapy as ISK on 09/25/24 and never resolved LOC left cheek  Review of Systems:    No other skin or systemic complaints except as noted in HPI or Assessment and Plan.  The following portions of the chart were reviewed this encounter and updated as appropriate: medications, allergies, medical history  Relevant Medical History:  Personal history of non melanoma skin cancer - see medical history for full details   Objective  (SKPE) Well appearing patient in no apparent distress; mood and affect are within normal limits. Examination was performed of the: Focused Exam of: Scalp and face   Examination notable for: Lentigo/lentigines: Scattered pigmented macules that are tan to brown in color and are somewhat non-uniform in shape and concentrated in the sun-exposed areas, Seborrheic Keratosis(es): Stuck-on appearing keratotic papule(s) on the trunk, some  irritated with redness, crusting, edema, and/or partial avulsion, Actinic Damage/Elastosis: chronic sun damage: dyspigmentation, telangiectasia, and wrinkling, Actinic keratosis: Scaly erythematous macule(s) concentrated on sun exposed areas   Examination limited by: Clothing and Patient deferred removal     Right Parietal Scalp 1.5cm pink plaque   Left cheek Stuck on waxy paps with erythema Left cheek Pink scaly macules  Assessment & Plan  (SKAP)    BENIGN SKIN FINDINGS  - Lentigines  - Seborrheic keratoses - Reassurance provided regarding the benign appearance of lesions noted on exam today; no treatment is indicated in the absence of symptoms/changes. - Reinforced importance of photoprotective strategies including liberal and frequent sunscreen use of a broad-spectrum SPF 30 or greater, use of protective clothing, and sun avoidance for  prevention of cutaneous malignancy and photoaging.  Counseled patient on the importance of regular self-skin monitoring as well as routine clinical skin examinations as scheduled.   ACTINIC DAMAGE - Chronic condition, secondary to cumulative UV/sun exposure - Recommend daily broad spectrum sunscreen SPF 30+ to sun-exposed areas, reapply every 2 hours as needed.  - Staying in the shade or wearing long sleeves, sun glasses (UVA+UVB protection) and wide brim hats (4-inch brim around the entire circumference of the hat) are also recommended for sun protection.  - Call for new or changing lesions.  Personal history of non melanoma skin cancer  - Reviewed medical history for full details  - Reviewed sun protective measures as above - Encouraged full body skin exams     Was sun protection counseling provided?: Yes   Level of service outlined above   Patient instructions (SKPI)   Procedures, orders, diagnosis for this visit:  NEOPLASM OF UNCERTAIN BEHAVIOR Right Parietal Scalp - Skin / nail biopsy Type of biopsy: tangential   Informed consent: discussed and consent obtained   Timeout: patient name, date of birth, surgical site, and procedure verified   Procedure prep:  Patient was prepped and draped in usual sterile fashion Prep type:  Isopropyl alcohol Anesthesia: the lesion was anesthetized in a standard fashion   Anesthetic:  1% lidocaine w/ epinephrine 1-100,000 buffered w/ 8.4% NaHCO3 Instrument used: DermaBlade   Hemostasis achieved with: pressure and aluminum chloride   Outcome: patient tolerated procedure well   Post-procedure details: sterile dressing applied and wound care instructions given   Dressing type: bandage and petrolatum    Specimen 1 - Surgical pathology Differential Diagnosis: SK vs SCC vs BCC vs Seb derm vs  erosive pust derm  Check Margins: No INFLAMED SEBORRHEIC KERATOSIS Left cheek Symptomatic, irritating, patient would like treated. - Destruction of lesion  - Left cheek Complexity: simple   Destruction method: cryotherapy   Informed consent: discussed and consent obtained   Timeout:  patient name, date of birth, surgical site, and procedure verified Lesion destroyed using liquid nitrogen: Yes   Region frozen until ice ball extended beyond lesion: Yes   Cryo cycles: 1 or 2. Outcome: patient tolerated procedure well with no complications   Post-procedure details: wound care instructions given    ACTINIC KERATOSIS Left cheek Actinic keratoses are precancerous spots that appear secondary to cumulative UV radiation exposure/sun exposure over time. They are chronic with expected duration over 1 year. A portion of actinic keratoses will progress to squamous cell carcinoma of the skin. It is not possible to reliably predict which spots will progress to skin cancer and so treatment is recommended to prevent development of skin cancer.  Recommend daily broad spectrum sunscreen SPF 30+ to sun-exposed areas, reapply every 2 hours as needed.  Recommend staying in the shade or wearing long sleeves, sun glasses (UVA+UVB protection) and wide brim hats (4-inch brim around the entire circumference of the hat). Call for new or changing lesions. - Destruction of lesion - Left cheek Complexity: simple   Destruction method: cryotherapy   Informed consent: discussed and consent obtained   Timeout:  patient name, date of birth, surgical site, and procedure verified Lesion destroyed using liquid nitrogen: Yes   Region frozen until ice ball extended beyond lesion: Yes   Cryo cycles: 1 or 2. Outcome: patient tolerated procedure well with no complications   Post-procedure details: wound care instructions given    LENTIGO   SEBORRHEIC KERATOSIS   NON-MELANOMA SKIN CANCER    Neoplasm of uncertain behavior -     Skin / nail biopsy -     Surgical pathology; Standing  Inflamed seborrheic keratosis -     Destruction of lesion  Actinic keratosis -      Destruction of lesion  Lentigo  Seborrheic keratosis  Non-melanoma skin cancer    Return to clinic: Return if symptoms worsen or fail to improve.  I, Emerick Ege, CMA am acting as scribe for Lauraine JAYSON Kanaris, MD.   Documentation: I have reviewed the above documentation for accuracy and completeness, and I agree with the above.  Lauraine JAYSON Kanaris, MD  "

## 2024-12-18 LAB — SURGICAL PATHOLOGY

## 2024-12-22 ENCOUNTER — Ambulatory Visit: Payer: Self-pay

## 2024-12-22 ENCOUNTER — Telehealth: Payer: Self-pay

## 2024-12-22 DIAGNOSIS — L570 Actinic keratosis: Secondary | ICD-10-CM

## 2024-12-22 MED ORDER — FLUOROURACIL 5 % EX CREA: TOPICAL_CREAM | Freq: Two times a day (BID) | CUTANEOUS | 0 refills | Status: AC

## 2024-12-22 NOTE — Telephone Encounter (Addendum)
 Patient called and left message on nurse line concerning his recent pathology results. Patient states he has reviewed results on mychart. Explained Dr. Raymund is still reviewing results and will give call once we have her final comments.

## 2024-12-22 NOTE — Telephone Encounter (Signed)
-----   Message from Lauraine Kanaris, MD sent at 12/22/2024  8:29 AM EST -----  Please notify patient with below plan: Efudex  bid x 3-4 weeks, recheck in 6 wks.     1. Skin, right parietal scalp :       ACTINIC KERATOSIS WITH SCAR    - After discusison of options for treatment including risks, benefits and alternatives, the patient elected to treat with a course of Efudex  to be applied to affected areas  - Start efudex  5% cream (5-fluorouracil ) twice daily for 3-4 weeks - Discussed the longer the product is used the more effective it is - Educated on the risk of redness, irritation, pain. Advised to hold treatment if developing side effects.  - Wash hands after use - Advised sun protection and avoidance

## 2024-12-22 NOTE — Telephone Encounter (Signed)
 Discussed pathology results with patient and medication sent to pharmacy.

## 2025-09-24 ENCOUNTER — Ambulatory Visit: Admitting: Dermatology
